# Patient Record
Sex: Female | Born: 1960 | Race: White | Hispanic: No | Marital: Single | Smoking: Never smoker
Health system: Southern US, Community
[De-identification: ages and names within clinical notes are randomized; demographics above are authoritative.]

## PROBLEM LIST (undated history)

## (undated) DIAGNOSIS — K219 Gastro-esophageal reflux disease without esophagitis: Secondary | ICD-10-CM

## (undated) DIAGNOSIS — E039 Hypothyroidism, unspecified: Secondary | ICD-10-CM

## (undated) DIAGNOSIS — M1990 Unspecified osteoarthritis, unspecified site: Secondary | ICD-10-CM

## (undated) HISTORY — PX: CHOLECYSTECTOMY: SHX55

---

## 1996-10-17 HISTORY — PX: TUBAL LIGATION: SHX77

## 2020-04-14 ENCOUNTER — Ambulatory Visit (INDEPENDENT_AMBULATORY_CARE_PROVIDER_SITE_OTHER): Payer: Self-pay

## 2020-04-14 ENCOUNTER — Ambulatory Visit: Payer: Self-pay | Admitting: Orthopaedic Surgery

## 2020-04-14 VITALS — Ht 60.0 in | Wt 192.0 lb

## 2020-04-14 DIAGNOSIS — M1611 Unilateral primary osteoarthritis, right hip: Secondary | ICD-10-CM

## 2020-04-14 NOTE — Progress Notes (Signed)
Office Visit Note   Patient: Carla Mckee           Date of Birth: 07-07-1961           MRN: [id number] Visit Date: 04/14/2020              Requested by: Cheral Bay, MD [address] [place],  [place] [address] PCP: Cheral Bay, MD   Assessment & Plan: Visit Diagnoses:  1. Primary osteoarthritis of right hip     Plan: I reviewed the hip x-rays today and she has severe degenerative joint disease with superior migration and subluxation of the femoral head and wear of the superior acetabulum.  Her leg is significantly shorter.  Given these findings I have recommended total hip replacement.  I do not feel that any medications or injections would provide her with any real relief.  Based on discussion risk benefits rehab recovery alternatives to surgery she has elected to proceed with the surgery in the near future.  We will be in touch with the patient soon.  Follow-Up Instructions: Return if symptoms worsen or fail to improve.   Orders:  Orders Placed This Encounter  Procedures  . XR HIP UNILAT W OR W/O PELVIS 2-3 VIEWS RIGHT   No orders of the defined types were placed in this encounter.     Procedures: No procedures performed   Clinical Data: No additional findings.   Subjective: Chief Complaint  Patient presents with  . Right Hip - Pain    Carla Mckee is a very pleasant 59 year old who comes in for chronic right hip pain for 4 years.  She has deep-seated right hip and groin pain that radiates into the thigh.  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  Denies any radicular symptoms.  She has taken over-the-counter medications without significant relief.   Review of Systems  Constitutional: Negative.   HENT: Negative.   Eyes: Negative.   Respiratory: Negative.   Cardiovascular: Negative.   Endocrine: Negative.   Musculoskeletal: Negative.   Neurological: Negative.   Hematological: Negative.   Psychiatric/Behavioral: Negative.    All other systems reviewed and are negative.    Objective: Vital Signs: Ht 5' (1.524 m)   Wt 192 lb (87.1 kg)   BMI 37.50 kg/m   Physical Exam Vitals and nursing note reviewed.  Constitutional:      Appearance: She is well-developed.  HENT:     Head: Normocephalic and atraumatic.  Pulmonary:     Effort: Pulmonary effort is normal.  Abdominal:     Palpations: Abdomen is soft.  Musculoskeletal:     Cervical back: Neck supple.  Skin:    General: Skin is warm.     Capillary Refill: Capillary refill takes less than 2 seconds.  Neurological:     Mental Status: She is alert and oriented to person, place, and time.  Psychiatric:        Behavior: Behavior normal.        Thought Content: Thought content normal.        Judgment: Judgment normal.     Ortho Exam Right hip shows severe pain with any attempted range of motion.  She has a severe antalgic gait.  She has to hop in order to walk. Specialty Comments:  No specialty comments available.  Imaging: XR HIP UNILAT W OR W/O PELVIS 2-3 VIEWS RIGHT  Result Date: 04/14/2020 Severe right hip DJD with superior migration of femoral head and wear of the  superior acetabulum.    PMFS History: There are no problems to display for this patient.  No past medical history on file.  No family history on file.   Social History   Occupational History  . Not on file  Tobacco Use  . Smoking status: Not on file  Substance and Sexual Activity  . Alcohol use: Not on file  . Drug use: Not on file  . Sexual activity: Not on file

## 2020-04-15 ENCOUNTER — Telehealth: Payer: Self-pay | Admitting: Orthopaedic Surgery

## 2020-04-15 NOTE — Telephone Encounter (Signed)
Patient called.   She was seen in our office yesterday by Dr.Xu but she states that it was in error. She said she was supposed to be seeing Dr.Blackman as that is who a family member of hers recommended.   She followed through with the appointment and was recommended for surgery. She wants to know what she needs to do to ensure the surgery is with Dr. Magnus Ivan.   Call back: [phone]

## 2020-04-15 NOTE — Telephone Encounter (Signed)
I called patient [phone] and left voice mail that she needs to return call to make OV appt. with Dr. Magnus Ivan.

## 2020-04-15 NOTE — Telephone Encounter (Signed)
Patient called back and I made appt to see Dr. Magnus Ivan next week.

## 2020-04-15 NOTE — Telephone Encounter (Signed)
Is there any way we can get her in next week for a quick appointment for a face-to-face discussion?  Which you be okay with that.

## 2020-04-22 ENCOUNTER — Other Ambulatory Visit: Payer: Self-pay

## 2020-04-22 ENCOUNTER — Ambulatory Visit (INDEPENDENT_AMBULATORY_CARE_PROVIDER_SITE_OTHER): Payer: Self-pay | Admitting: Orthopaedic Surgery

## 2020-04-22 DIAGNOSIS — M1612 Unilateral primary osteoarthritis, left hip: Secondary | ICD-10-CM

## 2020-04-22 DIAGNOSIS — M1611 Unilateral primary osteoarthritis, right hip: Secondary | ICD-10-CM

## 2020-04-22 NOTE — Progress Notes (Signed)
Office Visit Note   Patient: Carla Mckee           Date of Birth: [birth year]           MRN: [id number] Visit Date: 04/22/2020              Requested by: Cheral Bay, MD [address] [place],  [place] [address] PCP: Cheral Bay, MD   Assessment & Plan: Visit Diagnoses:  1. Primary osteoarthritis of right hip   2. Primary osteoarthritis of left hip     Plan: I agree that she does need a hip replacement on the right side and likely on the left side eventually.  Her right side is more symptomatic and much worse radiographically.  I had a long thorough discussion with her about hip replacement surgery.  We talked about the risk and benefits of surgery.  I showed her hip model and gave her handout about hip replacement surgery.  I described in detail her interoperative and postoperative course.  This is something she would like to have scheduled hopefully in the near future.  All questions and concerns were answered and addressed.  Follow-Up Instructions: Return for 2 weeks post-op.   Orders:  No orders of the defined types were placed in this encounter.  No orders of the defined types were placed in this encounter.     Procedures: No procedures performed   Clinical Data: No additional findings.   Subjective: Chief Complaint  Patient presents with  . Right Hip - Pain  The patient comes in today for me to talk with her about hip replacement surgery.  She actually has bilateral hip pain but her right hip is way more severe.  I actually replaced her brothers hip.  She specifically wanted to see me.  She did see my partner Dr. Roda Shutters last week in his clinic and he appropriately recommended hip replacement surgery.  She then called our office back and stated that since I had done her brother surgery, she originally wanted to be scheduled with me and would like me to consider hip replacement surgery on her.  She was very pleased with Dr. Roda Shutters, but since I have  relationship with her family and her brother is done well, she would like me to perform her surgery.  Her hip pain is daily bilaterally but the right is much worse than left.  It is severe.  It has detrimentally affected her mobility, her quality of life, and her activities day living.  She is work on weight loss and activity modification.  She tried anti-inflammatories.  She has had all forms of conservative treatment.  Her pain is now 10 out of 10.  She has failed conservative treatment for over a year.  She even uses a cane to ambulate.  Her mobility is significantly limited at this point.  HPI  Review of Systems She currently denies any headache, chest pain, shortness of breath, fever, chills, nausea, vomiting  Objective: Vital Signs: There were no vitals taken for this visit.  Physical Exam She is alert and orient x3 and in no acute distress Ortho ExamExamination of her right and left hip shows severe limitations in motion patient with stiffness and significant pain much worse on the right than the left.  She walks with a Trendelenburg gait favoring the right side.  She has significant truncal obesity but her abdomen is easily mobilized out of the way of the surgical field. Specialty Comments:  No  specialty comments available.  Imaging: No results found. X-rays of both hips are independently reviewed.  She has severe end-stage arthritis more so on the right than left but both him are very severe.  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  PMFS History: Patient Active Problem List   Diagnosis Date Noted  . Primary osteoarthritis of right hip 04/22/2020  . Primary osteoarthritis of left hip 04/22/2020   No past medical history on file.  No family history on file.   Social History   Occupational History  . Not on file  Tobacco Use  . Smoking  status: Not on file  Substance and Sexual Activity  . Alcohol use: Not on file  . Drug use: Not on file  . Sexual activity: Not on file

## 2020-04-29 ENCOUNTER — Telehealth: Payer: Self-pay | Admitting: Orthopaedic Surgery

## 2020-04-29 NOTE — Telephone Encounter (Signed)
[employer] forms received. Sent to [employer]. 

## 2020-05-01 ENCOUNTER — Other Ambulatory Visit: Payer: Self-pay

## 2020-05-06 ENCOUNTER — Other Ambulatory Visit: Payer: Self-pay | Admitting: Physician Assistant

## 2020-05-08 ENCOUNTER — Telehealth: Payer: Self-pay | Admitting: Orthopaedic Surgery

## 2020-05-08 NOTE — Telephone Encounter (Signed)
Pt would like to verify we received a fax containing her FMLA paperwork.

## 2020-05-11 NOTE — Telephone Encounter (Signed)
Forms have been received and sent to [employer]. Tried to call cell ph,mailbox is full.

## 2020-05-12 NOTE — Progress Notes (Signed)
[id number] Carla Mckee, [place] - [address] AT [facility] [address] [place] [address] Phone: [phone] Fax: [phone]      Your procedure is scheduled on Tuesday August 3  Report to [facility] Main Entrance "A" at 1000 A.M., and check in at the Admitting office.  Call this number if you have problems the morning of surgery:  [phone]  Call [phone] if you have any questions prior to your surgery date Monday-Friday 8am-4pm    Remember:  Do not eat after midnight the night before your surgery  You may drink clear liquids until 0900 am the morning of your surgery.   Clear liquids allowed are: Water, Non-Citrus Juices (without pulp), Carbonated Beverages, Clear Tea, Black Coffee Only, and Gatorade    Enhanced Recovery after Surgery for Orthopedics Enhanced Recovery after Surgery is a protocol used to improve the stress on your body and your recovery after surgery.  Patient Instructions  . The night before surgery:  o No food after midnight. ONLY clear liquids after midnight  .  Marland Kitchen The day of surgery (if you do NOT have diabetes):  o Drink ONE (1) Pre-Surgery Clear Ensure by __0900___ am the morning of surgery   o This drink was given to you during your hospital  pre-op appointment visit. o Nothing else to drink after completing the  Pre-Surgery Clear Ensure.          If you have questions, please contact your surgeon's office.   Take these medicines the morning of surgery with A SIP OF WATER  HYDROcodone-acetaminophen (NORCO)  If needed levothyroxine (SYNTHROID)  As of today, STOP taking any Aspirin (unless otherwise instructed by your surgeon) Aleve, Naproxen, Ibuprofen, Motrin, Advil, Goody's, BC's, all herbal medications, fish oil, and all vitamins.                      Do not wear jewelry, make up, or nail polish            Do not wear lotions, powders, perfumes, or deodorant.             Do not shave 48 hours prior to surgery.             Do not bring valuables to the hospital.            [facility] is not responsible for any belongings or valuables.  Do NOT Smoke (Tobacco/Vaping) or drink Alcohol 24 hours prior to your procedure If you use a CPAP at night, you may bring all equipment for your overnight stay.   Contacts, glasses, dentures or bridgework may not be worn into surgery.      For patients admitted to the hospital, discharge time will be determined by your treatment team.   Patients discharged the day of surgery will not be allowed to drive home, and someone needs to stay with them for 24 hours.    Special instructions:   [facility]- Preparing For Surgery  Before surgery, you can play an important role. Because skin is not sterile, your skin needs to be as free of germs as possible. You can reduce the number of germs on your skin by washing with CHG (chlorahexidine gluconate) Soap before surgery.  CHG is an antiseptic cleaner which kills germs and bonds with the skin to continue killing germs even after washing.    Oral Hygiene is also important to reduce your  risk of infection.  Remember - BRUSH YOUR TEETH THE MORNING OF SURGERY WITH YOUR REGULAR TOOTHPASTE  Please do not use if you have an allergy to CHG or antibacterial soaps. If your skin becomes reddened/irritated stop using the CHG.  Do not shave (including legs and underarms) for at least 48 hours prior to first CHG shower. It is OK to shave your face.  Please follow these instructions carefully.   1. Shower the NIGHT BEFORE SURGERY and the MORNING OF SURGERY with CHG Soap.   2. If you chose to wash your hair, wash your hair first as usual with your normal shampoo.  3. After you shampoo, rinse your hair and body thoroughly to remove the shampoo.  4. Use CHG as you would any other liquid soap. You can apply CHG directly to the skin and wash gently with a scrungie or a clean washcloth.   5. Apply  the CHG Soap to your body ONLY FROM THE NECK DOWN.  Do not use on open wounds or open sores. Avoid contact with your eyes, ears, mouth and genitals (private parts). Wash Face and genitals (private parts)  with your normal soap.   6. Wash thoroughly, paying special attention to the area where your surgery will be performed.  7. Thoroughly rinse your body with warm water from the neck down.  8. DO NOT shower/wash with your normal soap after using and rinsing off the CHG Soap.  9. Pat yourself dry with a CLEAN TOWEL.  10. Wear CLEAN PAJAMAS to bed the night before surgery  11. Place CLEAN SHEETS on your bed the night of your first shower and DO NOT SLEEP WITH PETS.   Day of Surgery: Wear Clean/Comfortable clothing the morning of surgery Do not apply any deodorants/lotions.   Remember to brush your teeth WITH YOUR REGULAR TOOTHPASTE.   Please read over the following fact sheets that you were given.

## 2020-05-13 ENCOUNTER — Encounter (HOSPITAL_COMMUNITY)
Admission: RE | Admit: 2020-05-13 | Discharge: 2020-05-13 | Disposition: A | Discharge status: Home / Self Care | Payer: Self-pay | Source: Ambulatory Visit | Attending: Orthopaedic Surgery | Admitting: Orthopaedic Surgery

## 2020-05-13 ENCOUNTER — Other Ambulatory Visit: Payer: Self-pay

## 2020-05-13 ENCOUNTER — Telehealth: Payer: Self-pay | Admitting: Orthopaedic Surgery

## 2020-05-13 ENCOUNTER — Encounter (HOSPITAL_COMMUNITY): Payer: Self-pay

## 2020-05-13 DIAGNOSIS — Z01812 Encounter for preprocedural laboratory examination: Secondary | ICD-10-CM | POA: Insufficient documentation

## 2020-05-13 HISTORY — DX: Gastro-esophageal reflux disease without esophagitis: K21.9

## 2020-05-13 HISTORY — DX: Hypothyroidism, unspecified: E03.9

## 2020-05-13 LAB — CBC
HCT: 40.3 % (ref 36.0–46.0)
Hemoglobin: 12.7 g/dL (ref 12.0–15.0)
MCH: 30.5 pg (ref 26.0–34.0)
MCHC: 31.5 g/dL (ref 30.0–36.0)
MCV: 96.9 fL (ref 80.0–100.0)
Platelets: 457 10*3/uL — ABNORMAL HIGH (ref 150–400)
RBC: 4.16 MIL/uL (ref 3.87–5.11)
RDW: 13.5 % (ref 11.5–15.5)
WBC: 5.5 10*3/uL (ref 4.0–10.5)
nRBC: 0 % (ref 0.0–0.2)

## 2020-05-13 LAB — TYPE AND SCREEN
ABO/RH(D): A POS
Antibody Screen: NEGATIVE

## 2020-05-13 LAB — SURGICAL PCR SCREEN
MRSA, PCR: NEGATIVE
Staphylococcus aureus: NEGATIVE

## 2020-05-13 NOTE — Progress Notes (Signed)
PCP - Derek Jack  Chest x-ray - n/a EKG - n/a  ERAS Protcol - yes, Ensure ordered & given   COVID TEST- Friday 05-15-20   Anesthesia review: n/a  Patient denies shortness of breath, fever, cough and chest pain at PAT appointment   All instructions explained to the patient, with a verbal understanding of the material. Patient agrees to go over the instructions while at home for a better understanding. Patient also instructed to self quarantine after being tested for COVID-19. The opportunity to ask questions was provided.

## 2020-05-13 NOTE — Telephone Encounter (Signed)
Tried calling pt to let her know she has to bring exact cash for her [employer] payment; all three numbers went to voice mail and was full so I couldn't leave a message.

## 2020-05-15 ENCOUNTER — Other Ambulatory Visit (HOSPITAL_COMMUNITY): Payer: Self-pay

## 2020-05-15 ENCOUNTER — Telehealth: Payer: Self-pay

## 2020-05-15 NOTE — Telephone Encounter (Signed)
Surgery postponed to 09/07 to allow time for FMLA paperwork to be done.  Can you Rx something for pain?  She uses [facility].

## 2020-05-16 ENCOUNTER — Other Ambulatory Visit: Payer: Self-pay | Admitting: Orthopaedic Surgery

## 2020-05-16 MED ORDER — TRAMADOL HCL 50 MG PO TABS: 100.0000 mg | ORAL_TABLET | Freq: Four times a day (QID) | ORAL | 0 refills | Status: DC | PRN

## 2020-05-19 ENCOUNTER — Ambulatory Visit (HOSPITAL_COMMUNITY): Admission: RE | Admit: 2020-05-19 | Payer: Self-pay | Source: Home / Self Care | Admitting: Orthopaedic Surgery

## 2020-05-19 ENCOUNTER — Encounter (HOSPITAL_COMMUNITY): Admission: RE | Payer: Self-pay | Source: Home / Self Care

## 2020-05-19 SURGERY — ARTHROPLASTY, HIP, TOTAL, ANTERIOR APPROACH
Anesthesia: Spinal | Site: Hip | Laterality: Right

## 2020-05-27 ENCOUNTER — Telehealth: Payer: Self-pay | Admitting: Orthopaedic Surgery

## 2020-05-27 ENCOUNTER — Telehealth: Payer: Self-pay

## 2020-05-27 NOTE — Telephone Encounter (Signed)
Disability form from [employer] received and sent to [employer]

## 2020-05-27 NOTE — Telephone Encounter (Signed)
Pt called stating a intermediate form for work will be faxed and she would like for Dr.Blackman to keep an eye out for it.  [phone]

## 2020-05-29 ENCOUNTER — Encounter: Payer: Self-pay | Admitting: Orthopaedic Surgery

## 2020-05-29 ENCOUNTER — Telehealth: Payer: Self-pay | Admitting: Orthopaedic Surgery

## 2020-05-29 NOTE — Telephone Encounter (Signed)
Patient called.   Letting us know that [employer] sent over more paperwork on her behalf today.   Call back: [phone]

## 2020-06-01 NOTE — Telephone Encounter (Signed)
She has called several times, but most likely these are going through [employer] right?

## 2020-06-01 NOTE — Telephone Encounter (Signed)
Thank you :)

## 2020-06-01 NOTE — Telephone Encounter (Signed)
Received today, will send to [employer] with copy of her my chart msg.

## 2020-06-03 ENCOUNTER — Other Ambulatory Visit: Payer: Self-pay | Admitting: Orthopaedic Surgery

## 2020-06-03 ENCOUNTER — Telehealth: Payer: Self-pay | Admitting: Orthopaedic Surgery

## 2020-06-03 MED ORDER — HYDROCODONE-ACETAMINOPHEN 10-325 MG PO TABS: 1.0000 | ORAL_TABLET | Freq: Two times a day (BID) | ORAL | 0 refills | Status: DC | PRN

## 2020-06-03 NOTE — Telephone Encounter (Signed)
Patient called.   Requesting a refill on her pain medications be sent in   Call back: [phone]

## 2020-06-03 NOTE — Telephone Encounter (Signed)
Please advise 

## 2020-06-05 ENCOUNTER — Telehealth: Payer: Self-pay | Admitting: Orthopaedic Surgery

## 2020-06-05 NOTE — Telephone Encounter (Signed)
Faxed updated forms to [employer] [phone]

## 2020-06-11 ENCOUNTER — Telehealth: Payer: Self-pay | Admitting: Orthopaedic Surgery

## 2020-06-11 NOTE — Telephone Encounter (Signed)
[employer] forms received via e-mail from patient. Completed and faxed to [facility]

## 2020-06-12 ENCOUNTER — Other Ambulatory Visit: Payer: Self-pay | Admitting: Orthopaedic Surgery

## 2020-06-15 ENCOUNTER — Other Ambulatory Visit: Payer: Self-pay | Admitting: Orthopaedic Surgery

## 2020-06-15 MED ORDER — HYDROCODONE-ACETAMINOPHEN 10-325 MG PO TABS: 1.0000 | ORAL_TABLET | Freq: Two times a day (BID) | ORAL | 0 refills | Status: DC | PRN

## 2020-06-15 NOTE — Telephone Encounter (Signed)
Please advise 

## 2020-06-16 ENCOUNTER — Other Ambulatory Visit: Payer: Self-pay | Admitting: Physician Assistant

## 2020-06-16 NOTE — Patient Instructions (Signed)
DUE TO COVID-19 ONLY ONE VISITOR IS ALLOWED TO COME WITH YOU AND STAY IN THE WAITING ROOM ONLY DURING  PRE OP AND PROCEDURE.   IF YOU WILL BE ADMITTED INTO THE HOSPITAL YOU ARE ALLOWED ONE SUPPORT PERSON DURING VISITATION HOURS  ONLY (10AM -8PM)   . The support person may change daily. . The support person must pass our screening, gel in and out, and wear a mask at all times, including in the patient's room. . Patients must also wear a mask when staff or their support person are in the room.   COVID SWAB TESTING MUST BE COMPLETED ON:  Friday, 06-19-20 @ 10:55     [address] [place], [place] [address]  (Must self quarantine after testing. Follow instructions on handout.)        Your procedure is scheduled on:  Tuesday, 06-23-20   Report to [facility] Main  Entrance    Report to admitting at 9:30 AM   Call this number if you have problems the morning of surgery [phone]   Do not eat food :After Midnight.   May have liquids until 9:00 AM  day of surgery   CLEAR LIQUID DIET  Foods Allowed                                                                     Foods Excluded  Water, Black Coffee and tea, regular and decaf          liquids that you cannot  Plain Jell-O in any flavor  (No red)                                    see through such as: Fruit ices (not with fruit pulp)                                      milk, soups, orange juice              Iced Popsicles (No red)                                      All solid food                                   Apple juices Sports drinks like Gatorade (No red) Lightly seasoned clear broth or consume(fat free) Sugar, honey syrup     Complete one Ensure drink the morning of surgery at 9:00 AM  the day of surgery.      Oral Hygiene is also important to reduce your risk of infection.                                    Remember - BRUSH YOUR TEETH THE MORNING OF SURGERY WITH YOUR REGULAR TOOTHPASTE   Do NOT smoke after  Midnight   Take  these medicines the morning of surgery with A SIP OF WATER:  Hydrocodone, Levothyroxine                                You may not have any metal on your body including hair pins, jewelry, and body piercings             Do not wear make-up, lotions, powders, perfumes/cologne, or deodorant             Do not wear nail polish.  Do not shave  48 hours prior to surgery.            Do not bring valuables to the hospital. [facility] IS NOT RESPONSIBLE   FOR VALUABLES.   Contacts, dentures or bridgework may not be worn into surgery.      Patients discharged the day of surgery will not be allowed to drive home.                Please read over the following fact sheets you were given: IF YOU HAVE QUESTIONS ABOUT YOUR PRE OP INSTRUCTIONS  PLEASE CALL [phone]   [facility] - Preparing for Surgery Before surgery, you can play an important role.  Because skin is not sterile, your skin needs to be as free of germs as possible.  You can reduce the number of germs on your skin by washing with CHG (chlorahexidine gluconate) soap before surgery.  CHG is an antiseptic cleaner which kills germs and bonds with the skin to continue killing germs even after washing. Please DO NOT use if you have an allergy to CHG or antibacterial soaps.  If your skin becomes reddened/irritated stop using the CHG and inform your nurse when you arrive at Short Stay. Do not shave (including legs and underarms) for at least 48 hours prior to the first CHG shower.  You may shave your face/neck.  Please follow these instructions carefully:  1.  Shower with CHG Soap the night before surgery and the  morning of surgery.  2.  If you choose to wash your hair, wash your hair first as usual with your normal  shampoo.  3.  After you shampoo, rinse your hair and body thoroughly to remove the shampoo.                             4.  Use CHG as you would any other liquid soap.  You can apply chg directly to the skin and  wash.  Gently with a scrungie or clean washcloth.  5.  Apply the CHG Soap to your body ONLY FROM THE NECK DOWN.   Do   not use on face/ open                           Wound or open sores. Avoid contact with eyes, ears mouth and   genitals (private parts).                       Wash face,  Genitals (private parts) with your normal soap.             6.  Wash thoroughly, paying special attention to the area where your    surgery  will be performed.  7.  Thoroughly rinse your body with warm water from the neck down.  8.  DO NOT shower/wash with your normal soap after using and rinsing off the CHG Soap.                9.  Pat yourself dry with a clean towel.            10.  Wear clean pajamas.            11.  Place clean sheets on your bed the night of your first shower and do not  sleep with pets. Day of Surgery : Do not apply any lotions/deodorants the morning of surgery.  Please wear clean clothes to the hospital/surgery center.  FAILURE TO FOLLOW THESE INSTRUCTIONS MAY RESULT IN THE CANCELLATION OF YOUR SURGERY  PATIENT SIGNATURE_________________________________  NURSE SIGNATURE__________________________________  ________________________________________________________________________   Rogelia Mire  An incentive spirometer is a tool that can help keep your lungs clear and active. This tool measures how well you are filling your lungs with each breath. Taking long deep breaths may help reverse or decrease the chance of developing breathing (pulmonary) problems (especially infection) following:  A long period of time when you are unable to move or be active. BEFORE THE PROCEDURE   If the spirometer includes an indicator to show your best effort, your nurse or respiratory therapist will set it to a desired goal.  If possible, sit up straight or lean slightly forward. Try not to slouch.  Hold the incentive spirometer in an upright position. INSTRUCTIONS FOR USE  1. Sit on the  edge of your bed if possible, or sit up as far as you can in bed or on a chair. 2. Hold the incentive spirometer in an upright position. 3. Breathe out normally. 4. Place the mouthpiece in your mouth and seal your lips tightly around it. 5. Breathe in slowly and as deeply as possible, raising the piston or the ball toward the top of the column. 6. Hold your breath for 3-5 seconds or for as long as possible. Allow the piston or ball to fall to the bottom of the column. 7. Remove the mouthpiece from your mouth and breathe out normally. 8. Rest for a few seconds and repeat Steps 1 through 7 at least 10 times every 1-2 hours when you are awake. Take your time and take a few normal breaths between deep breaths. 9. The spirometer may include an indicator to show your best effort. Use the indicator as a goal to work toward during each repetition. 10. After each set of 10 deep breaths, practice coughing to be sure your lungs are clear. If you have an incision (the cut made at the time of surgery), support your incision when coughing by placing a pillow or rolled up towels firmly against it. Once you are able to get out of bed, walk around indoors and cough well. You may stop using the incentive spirometer when instructed by your caregiver.  RISKS AND COMPLICATIONS  Take your time so you do not get dizzy or light-headed.  If you are in pain, you may need to take or ask for pain medication before doing incentive spirometry. It is harder to take a deep breath if you are having pain. AFTER USE  Rest and breathe slowly and easily.  It can be helpful to keep track of a log of your progress. Your caregiver can provide you with a simple table to help with this. If you are using the spirometer at home, follow these instructions: SEEK MEDICAL CARE IF:   You  are having difficultly using the spirometer.  You have trouble using the spirometer as often as instructed.  Your pain medication is not giving enough  relief while using the spirometer.  You develop fever of 100.5 F (38.1 C) or higher. SEEK IMMEDIATE MEDICAL CARE IF:   You cough up bloody sputum that had not been present before.  You develop fever of 102 F (38.9 C) or greater.  You develop worsening pain at or near the incision site. MAKE SURE YOU:   Understand these instructions.  Will watch your condition.  Will get help right away if you are not doing well or get worse. Document Released: 02/13/2007 Document Revised: 12/26/2011 Document Reviewed: 04/16/2007 [employer] Patient Information 2014 [employer], [place].   ________________________________________________________________________  WHAT IS A BLOOD TRANSFUSION? Blood Transfusion Information  A transfusion is the replacement of blood or some of its parts. Blood is made up of multiple cells which provide different functions.  Red blood cells carry oxygen and are used for blood loss replacement.  White blood cells fight against infection.  Platelets control bleeding.  Plasma helps clot blood.  Other blood products are available for specialized needs, such as hemophilia or other clotting disorders. BEFORE THE TRANSFUSION  Who gives blood for transfusions?   Healthy volunteers who are fully evaluated to make sure their blood is safe. This is blood bank blood. Transfusion therapy is the safest it has ever been in the practice of medicine. Before blood is taken from a donor, a complete history is taken to make sure that person has no history of diseases nor engages in risky social behavior (examples are intravenous drug use or sexual activity with multiple partners). The donor's travel history is screened to minimize risk of transmitting infections, such as malaria. The donated blood is tested for signs of infectious diseases, such as HIV and hepatitis. The blood is then tested to be sure it is compatible with you in order to minimize the chance of a transfusion reaction. If  you or a relative donates blood, this is often done in anticipation of surgery and is not appropriate for emergency situations. It takes many days to process the donated blood. RISKS AND COMPLICATIONS Although transfusion therapy is very safe and saves many lives, the main dangers of transfusion include:   Getting an infectious disease.  Developing a transfusion reaction. This is an allergic reaction to something in the blood you were given. Every precaution is taken to prevent this. The decision to have a blood transfusion has been considered carefully by your caregiver before blood is given. Blood is not given unless the benefits outweigh the risks. AFTER THE TRANSFUSION  Right after receiving a blood transfusion, you will usually feel much better and more energetic. This is especially true if your red blood cells have gotten low (anemic). The transfusion raises the level of the red blood cells which carry oxygen, and this usually causes an energy increase.  The nurse administering the transfusion will monitor you carefully for complications. HOME CARE INSTRUCTIONS  No special instructions are needed after a transfusion. You may find your energy is better. Speak with your caregiver about any limitations on activity for underlying diseases you may have. SEEK MEDICAL CARE IF:   Your condition is not improving after your transfusion.  You develop redness or irritation at the intravenous (IV) site. SEEK IMMEDIATE MEDICAL CARE IF:  Any of the following symptoms occur over the next 12 hours:  Shaking chills.  You have a temperature by  mouth above 102 F (38.9 C), not controlled by medicine.  Chest, back, or muscle pain.  People around you feel you are not acting correctly or are confused.  Shortness of breath or difficulty breathing.  Dizziness and fainting.  You get a rash or develop hives.  You have a decrease in urine output.  Your urine turns a dark color or changes to pink,  red, or brown. Any of the following symptoms occur over the next 10 days:  You have a temperature by mouth above 102 F (38.9 C), not controlled by medicine.  Shortness of breath.  Weakness after normal activity.  The white part of the eye turns yellow (jaundice).  You have a decrease in the amount of urine or are urinating less often.  Your urine turns a dark color or changes to pink, red, or brown. Document Released: 09/30/2000 Document Revised: 12/26/2011 Document Reviewed: 05/19/2008 [facility] Patient Information 2014 [place], [place].  _______________________________________________________________________

## 2020-06-16 NOTE — Progress Notes (Addendum)
COVID Vaccine Completed:  No Date COVID Vaccine completed: COVID vaccine manufacturer: Pfizer    Moderna   [employer]   PCP - Derek Jack, MD Cardiologist -   Chest x-ray -  EKG -  Stress Test -  ECHO -  Cardiac Cath -   Sleep Study -  CPAP -   Fasting Blood Sugar -  Checks Blood Sugar _____ times a day  Blood Thinner Instructions: Aspirin Instructions: Last Dose:  Anesthesia review:   Patient denies shortness of breath, fever, cough and chest pain at PAT appointment   Patient verbalized understanding of instructions that were given to them at the PAT appointment. Patient was also instructed that they will need to review over the PAT instructions again at home before surgery.

## 2020-06-19 ENCOUNTER — Encounter (HOSPITAL_COMMUNITY)
Admission: RE | Admit: 2020-06-19 | Discharge: 2020-06-19 | Disposition: A | Discharge status: Home / Self Care | Payer: Self-pay | Source: Ambulatory Visit | Attending: Orthopaedic Surgery | Admitting: Orthopaedic Surgery

## 2020-06-19 ENCOUNTER — Other Ambulatory Visit (HOSPITAL_COMMUNITY): Payer: Self-pay

## 2020-06-19 ENCOUNTER — Other Ambulatory Visit (HOSPITAL_COMMUNITY)
Admission: RE | Admit: 2020-06-19 | Discharge: 2020-06-19 | Disposition: A | Discharge status: Home / Self Care | Payer: Self-pay | Source: Ambulatory Visit | Attending: Orthopaedic Surgery | Admitting: Orthopaedic Surgery

## 2020-06-19 ENCOUNTER — Other Ambulatory Visit: Payer: Self-pay

## 2020-06-19 ENCOUNTER — Encounter (HOSPITAL_COMMUNITY): Payer: Self-pay

## 2020-06-19 ENCOUNTER — Inpatient Hospital Stay (HOSPITAL_COMMUNITY): Admission: RE | Admit: 2020-06-19 | Payer: Self-pay | Source: Ambulatory Visit

## 2020-06-19 DIAGNOSIS — Z01812 Encounter for preprocedural laboratory examination: Secondary | ICD-10-CM | POA: Insufficient documentation

## 2020-06-19 DIAGNOSIS — Z20822 Contact with and (suspected) exposure to covid-19: Secondary | ICD-10-CM | POA: Insufficient documentation

## 2020-06-19 HISTORY — DX: Unspecified osteoarthritis, unspecified site: M19.90

## 2020-06-19 LAB — CBC
HCT: 42.8 % (ref 36.0–46.0)
Hemoglobin: 13.2 g/dL (ref 12.0–15.0)
MCH: 30.7 pg (ref 26.0–34.0)
MCHC: 30.8 g/dL (ref 30.0–36.0)
MCV: 99.5 fL (ref 80.0–100.0)
Platelets: 426 10*3/uL — ABNORMAL HIGH (ref 150–400)
RBC: 4.3 MIL/uL (ref 3.87–5.11)
RDW: 13.5 % (ref 11.5–15.5)
WBC: 6.4 10*3/uL (ref 4.0–10.5)
nRBC: 0 % (ref 0.0–0.2)

## 2020-06-19 LAB — SURGICAL PCR SCREEN
MRSA, PCR: NEGATIVE
Staphylococcus aureus: NEGATIVE

## 2020-06-19 LAB — SARS CORONAVIRUS 2 (TAT 6-24 HRS): SARS Coronavirus 2: NEGATIVE

## 2020-06-22 NOTE — Anesthesia Preprocedure Evaluation (Addendum)
Anesthesia Evaluation  Patient identified by MRN, date of birth, ID band Patient awake    Reviewed: Allergy & Precautions, NPO status , Patient's Chart, lab work & pertinent test results  Airway Mallampati: II  TM Distance: >3 FB Neck ROM: Full    Dental no notable dental hx. (+) Teeth Intact, Dental Advisory Given   Pulmonary neg pulmonary ROS,    Pulmonary exam normal breath sounds clear to auscultation       Cardiovascular Exercise Tolerance: Good negative cardio ROS Normal cardiovascular exam Rhythm:Regular Rate:Normal     Neuro/Psych negative neurological ROS  negative psych ROS   GI/Hepatic Neg liver ROS, GERD  ,  Endo/Other  Hypothyroidism   Renal/GU negative Renal ROS     Musculoskeletal  (+) Arthritis ,   Abdominal   Peds  Hematology negative hematology ROS (+) Hgb 13.2 Plt 426   Anesthesia Other Findings   Reproductive/Obstetrics                            Anesthesia Physical Anesthesia Plan  ASA: II  Anesthesia Plan: Spinal   Post-op Pain Management:    Induction:   PONV Risk Score and Plan: 3 and Treatment may vary due to age or medical condition, Midazolam, Ondansetron and Propofol infusion  Airway Management Planned: Natural Airway  Additional Equipment: None  Intra-op Plan:   Post-operative Plan:   Informed Consent: I have reviewed the patients History and Physical, chart, labs and discussed the procedure including the risks, benefits and alternatives for the proposed anesthesia with the patient or authorized representative who has indicated his/her understanding and acceptance.     Dental advisory given  Plan Discussed with: CRNA and Anesthesiologist  Anesthesia Plan Comments: (R THR under spinal)       Anesthesia Quick Evaluation

## 2020-06-23 ENCOUNTER — Ambulatory Visit (HOSPITAL_COMMUNITY): Payer: Self-pay | Admitting: Anesthesiology

## 2020-06-23 ENCOUNTER — Encounter (HOSPITAL_COMMUNITY): Payer: Self-pay | Admitting: Orthopaedic Surgery

## 2020-06-23 ENCOUNTER — Ambulatory Visit (HOSPITAL_COMMUNITY): Payer: Self-pay

## 2020-06-23 ENCOUNTER — Other Ambulatory Visit: Payer: Self-pay

## 2020-06-23 ENCOUNTER — Other Ambulatory Visit: Payer: Self-pay | Admitting: Orthopaedic Surgery

## 2020-06-23 ENCOUNTER — Encounter (HOSPITAL_COMMUNITY)
Admission: RE | Disposition: A | Discharge status: Home / Self Care | Payer: Self-pay | Source: Home / Self Care | Attending: Orthopaedic Surgery

## 2020-06-23 ENCOUNTER — Observation Stay (HOSPITAL_COMMUNITY)
Admission: RE | Admit: 2020-06-23 | Discharge: 2020-06-24 | Disposition: A | Discharge status: Home / Self Care | Payer: Self-pay | Attending: Orthopaedic Surgery | Admitting: Orthopaedic Surgery

## 2020-06-23 DIAGNOSIS — E039 Hypothyroidism, unspecified: Secondary | ICD-10-CM | POA: Insufficient documentation

## 2020-06-23 DIAGNOSIS — Z96641 Presence of right artificial hip joint: Secondary | ICD-10-CM

## 2020-06-23 DIAGNOSIS — M1990 Unspecified osteoarthritis, unspecified site: Secondary | ICD-10-CM | POA: Diagnosis present

## 2020-06-23 DIAGNOSIS — M1611 Unilateral primary osteoarthritis, right hip: Principal | ICD-10-CM

## 2020-06-23 DIAGNOSIS — Z419 Encounter for procedure for purposes other than remedying health state, unspecified: Secondary | ICD-10-CM

## 2020-06-23 DIAGNOSIS — Z79899 Other long term (current) drug therapy: Secondary | ICD-10-CM | POA: Insufficient documentation

## 2020-06-23 HISTORY — PX: TOTAL HIP ARTHROPLASTY: SHX124

## 2020-06-23 LAB — TYPE AND SCREEN
ABO/RH(D): A POS
Antibody Screen: NEGATIVE

## 2020-06-23 SURGERY — ARTHROPLASTY, HIP, TOTAL, ANTERIOR APPROACH
Anesthesia: Spinal | Site: Hip | Laterality: Right

## 2020-06-23 MED ORDER — ONDANSETRON HCL 4 MG/2ML IJ SOLN: 4.0000 mg | Freq: Four times a day (QID) | INTRAMUSCULAR | Status: DC | PRN

## 2020-06-23 MED ORDER — POVIDONE-IODINE 10 % EX SWAB
2.0000 "application " | Freq: Once | CUTANEOUS | Status: AC
  Administered 2020-06-23: 2 via TOPICAL

## 2020-06-23 MED ORDER — PHENOL 1.4 % MT LIQD: 1.0000 | OROMUCOSAL | Status: DC | PRN

## 2020-06-23 MED ORDER — ONDANSETRON 4 MG PO TBDP: 4.0000 mg | ORAL_TABLET | Freq: Three times a day (TID) | ORAL | 0 refills | Status: DC | PRN

## 2020-06-23 MED ORDER — ONDANSETRON HCL 4 MG/2ML IJ SOLN: 4.0000 mg | Freq: Once | INTRAMUSCULAR | Status: DC | PRN

## 2020-06-23 MED ORDER — BUPIVACAINE-EPINEPHRINE (PF) 0.25% -1:200000 IJ SOLN
INTRAMUSCULAR | Status: AC
  Filled 2020-06-23: qty 30

## 2020-06-23 MED ORDER — PROPOFOL 1000 MG/100ML IV EMUL
INTRAVENOUS | Status: AC
  Filled 2020-06-23: qty 100

## 2020-06-23 MED ORDER — DEXAMETHASONE SODIUM PHOSPHATE 10 MG/ML IJ SOLN
INTRAMUSCULAR | Status: AC
  Filled 2020-06-23: qty 1

## 2020-06-23 MED ORDER — TRANEXAMIC ACID-NACL 1000-0.7 MG/100ML-% IV SOLN
1000.0000 mg | INTRAVENOUS | Status: AC
  Administered 2020-06-23: 1000 mg via INTRAVENOUS
  Filled 2020-06-23: qty 100

## 2020-06-23 MED ORDER — ASPIRIN 81 MG PO CHEW
81.0000 mg | CHEWABLE_TABLET | Freq: Two times a day (BID) | ORAL | Status: DC
  Administered 2020-06-24: 81 mg via ORAL
  Filled 2020-06-23: qty 1

## 2020-06-23 MED ORDER — ACETAMINOPHEN 325 MG PO TABS: 325.0000 mg | ORAL_TABLET | Freq: Four times a day (QID) | ORAL | Status: DC | PRN

## 2020-06-23 MED ORDER — HYDROMORPHONE HCL 1 MG/ML IJ SOLN
0.5000 mg | INTRAMUSCULAR | Status: DC | PRN
  Administered 2020-06-24: 1 mg via INTRAVENOUS
  Filled 2020-06-23: qty 1

## 2020-06-23 MED ORDER — SODIUM CHLORIDE 0.9 % IR SOLN
Status: DC | PRN
  Administered 2020-06-23: 1000 mL

## 2020-06-23 MED ORDER — METOCLOPRAMIDE HCL 5 MG/ML IJ SOLN: 5.0000 mg | Freq: Three times a day (TID) | INTRAMUSCULAR | Status: DC | PRN

## 2020-06-23 MED ORDER — CEFAZOLIN SODIUM-DEXTROSE 1-4 GM/50ML-% IV SOLN
1.0000 g | Freq: Four times a day (QID) | INTRAVENOUS | Status: AC
  Administered 2020-06-23 – 2020-06-24 (×2): 1 g via INTRAVENOUS
  Filled 2020-06-23 (×2): qty 50

## 2020-06-23 MED ORDER — PROPOFOL 500 MG/50ML IV EMUL
INTRAVENOUS | Status: DC | PRN
  Administered 2020-06-23: 125 ug/kg/min via INTRAVENOUS

## 2020-06-23 MED ORDER — LACTATED RINGERS IV SOLN: INTRAVENOUS | Status: DC

## 2020-06-23 MED ORDER — SODIUM CHLORIDE 0.9 % IV SOLN: INTRAVENOUS | Status: DC

## 2020-06-23 MED ORDER — METHOCARBAMOL 500 MG PO TABS: 500.0000 mg | ORAL_TABLET | Freq: Four times a day (QID) | ORAL | 1 refills | Status: DC | PRN

## 2020-06-23 MED ORDER — 0.9 % SODIUM CHLORIDE (POUR BTL) OPTIME
TOPICAL | Status: DC | PRN
  Administered 2020-06-23: 1000 mL

## 2020-06-23 MED ORDER — MEPERIDINE HCL 50 MG/ML IJ SOLN: 6.2500 mg | INTRAMUSCULAR | Status: DC | PRN

## 2020-06-23 MED ORDER — ONDANSETRON 4 MG PO TBDP: 4.0000 mg | ORAL_TABLET | Freq: Three times a day (TID) | ORAL | Status: DC | PRN

## 2020-06-23 MED ORDER — ONDANSETRON HCL 4 MG PO TABS: 4.0000 mg | ORAL_TABLET | Freq: Four times a day (QID) | ORAL | Status: DC | PRN

## 2020-06-23 MED ORDER — ASPIRIN 81 MG PO CHEW: 81.0000 mg | CHEWABLE_TABLET | Freq: Two times a day (BID) | ORAL | 0 refills | Status: DC

## 2020-06-23 MED ORDER — PHENYLEPHRINE 40 MCG/ML (10ML) SYRINGE FOR IV PUSH (FOR BLOOD PRESSURE SUPPORT)
PREFILLED_SYRINGE | INTRAVENOUS | Status: DC | PRN
  Administered 2020-06-23: 80 ug via INTRAVENOUS
  Administered 2020-06-23: 40 ug via INTRAVENOUS
  Administered 2020-06-23 (×2): 80 ug via INTRAVENOUS
  Administered 2020-06-23: 120 ug via INTRAVENOUS

## 2020-06-23 MED ORDER — ONDANSETRON HCL 4 MG/2ML IJ SOLN
INTRAMUSCULAR | Status: AC
  Filled 2020-06-23: qty 2

## 2020-06-23 MED ORDER — METOCLOPRAMIDE HCL 5 MG PO TABS: 5.0000 mg | ORAL_TABLET | Freq: Three times a day (TID) | ORAL | Status: DC | PRN

## 2020-06-23 MED ORDER — OXYCODONE HCL 5 MG/5ML PO SOLN: 5.0000 mg | Freq: Once | ORAL | Status: AC | PRN

## 2020-06-23 MED ORDER — ASPIRIN 81 MG PO CHEW: 81.0000 mg | CHEWABLE_TABLET | Freq: Two times a day (BID) | ORAL | Status: DC

## 2020-06-23 MED ORDER — PHENYLEPHRINE 40 MCG/ML (10ML) SYRINGE FOR IV PUSH (FOR BLOOD PRESSURE SUPPORT)
PREFILLED_SYRINGE | INTRAVENOUS | Status: AC
  Filled 2020-06-23: qty 10

## 2020-06-23 MED ORDER — OXYCODONE HCL 5 MG PO TABS: 5.0000 mg | ORAL_TABLET | ORAL | Status: DC | PRN

## 2020-06-23 MED ORDER — STERILE WATER FOR IRRIGATION IR SOLN
Status: DC | PRN
  Administered 2020-06-23: 2000 mL

## 2020-06-23 MED ORDER — MENTHOL 3 MG MT LOZG: 1.0000 | LOZENGE | OROMUCOSAL | Status: DC | PRN

## 2020-06-23 MED ORDER — FUROSEMIDE 40 MG PO TABS
40.0000 mg | ORAL_TABLET | Freq: Every day | ORAL | Status: DC
  Administered 2020-06-23: 40 mg via ORAL
  Filled 2020-06-23 (×2): qty 1

## 2020-06-23 MED ORDER — CEFAZOLIN SODIUM-DEXTROSE 2-4 GM/100ML-% IV SOLN
2.0000 g | INTRAVENOUS | Status: AC
  Administered 2020-06-23: 2 g via INTRAVENOUS
  Filled 2020-06-23: qty 100

## 2020-06-23 MED ORDER — ALUM & MAG HYDROXIDE-SIMETH 200-200-20 MG/5ML PO SUSP: 30.0000 mL | ORAL | Status: DC | PRN

## 2020-06-23 MED ORDER — BUPIVACAINE IN DEXTROSE 0.75-8.25 % IT SOLN
INTRATHECAL | Status: DC | PRN
  Administered 2020-06-23: 1.6 mL via INTRATHECAL

## 2020-06-23 MED ORDER — OXYCODONE HCL 5 MG PO TABS
ORAL_TABLET | ORAL | Status: AC
Start: 2020-06-23 — End: 2020-06-24
  Filled 2020-06-23: qty 1

## 2020-06-23 MED ORDER — DOCUSATE SODIUM 100 MG PO CAPS
100.0000 mg | ORAL_CAPSULE | Freq: Two times a day (BID) | ORAL | Status: DC
  Administered 2020-06-23 – 2020-06-24 (×2): 100 mg via ORAL
  Filled 2020-06-23 (×2): qty 1

## 2020-06-23 MED ORDER — DEXAMETHASONE SODIUM PHOSPHATE 10 MG/ML IJ SOLN
INTRAMUSCULAR | Status: DC | PRN
  Administered 2020-06-23: 8 mg via INTRAVENOUS

## 2020-06-23 MED ORDER — FENTANYL CITRATE (PF) 100 MCG/2ML IJ SOLN: 25.0000 ug | INTRAMUSCULAR | Status: DC | PRN

## 2020-06-23 MED ORDER — AMISULPRIDE (ANTIEMETIC) 5 MG/2ML IV SOLN: 10.0000 mg | Freq: Once | INTRAVENOUS | Status: DC | PRN

## 2020-06-23 MED ORDER — LACTATED RINGERS IV BOLUS
500.0000 mL | Freq: Once | INTRAVENOUS | Status: AC
  Administered 2020-06-23: 500 mL via INTRAVENOUS

## 2020-06-23 MED ORDER — OXYCODONE HCL 5 MG PO TABS
5.0000 mg | ORAL_TABLET | Freq: Once | ORAL | Status: AC | PRN
  Administered 2020-06-23: 5 mg via ORAL

## 2020-06-23 MED ORDER — BUPIVACAINE-EPINEPHRINE 0.25% -1:200000 IJ SOLN
INTRAMUSCULAR | Status: DC | PRN
  Administered 2020-06-23: 30 mL

## 2020-06-23 MED ORDER — ONDANSETRON HCL 4 MG/2ML IJ SOLN
INTRAMUSCULAR | Status: DC | PRN
  Administered 2020-06-23: 4 mg via INTRAVENOUS

## 2020-06-23 MED ORDER — MIDAZOLAM HCL 2 MG/2ML IJ SOLN
INTRAMUSCULAR | Status: AC
  Filled 2020-06-23: qty 2

## 2020-06-23 MED ORDER — OXYCODONE HCL 5 MG PO TABS
10.0000 mg | ORAL_TABLET | ORAL | Status: DC | PRN
  Filled 2020-06-23: qty 2

## 2020-06-23 MED ORDER — LEVOTHYROXINE SODIUM 50 MCG PO TABS
175.0000 ug | ORAL_TABLET | Freq: Every day | ORAL | Status: DC
  Administered 2020-06-24: 175 ug via ORAL
  Filled 2020-06-23: qty 1

## 2020-06-23 MED ORDER — ORAL CARE MOUTH RINSE: 15.0000 mL | Freq: Once | OROMUCOSAL | Status: AC

## 2020-06-23 MED ORDER — LACTATED RINGERS IV BOLUS
250.0000 mL | Freq: Once | INTRAVENOUS | Status: AC
  Administered 2020-06-23: 250 mL via INTRAVENOUS

## 2020-06-23 MED ORDER — METHOCARBAMOL 500 MG PO TABS
500.0000 mg | ORAL_TABLET | Freq: Four times a day (QID) | ORAL | Status: DC | PRN
  Administered 2020-06-24: 500 mg via ORAL
  Filled 2020-06-23: qty 1

## 2020-06-23 MED ORDER — CHLORHEXIDINE GLUCONATE 0.12 % MT SOLN
15.0000 mL | Freq: Once | OROMUCOSAL | Status: AC
  Administered 2020-06-23: 15 mL via OROMUCOSAL

## 2020-06-23 MED ORDER — ACETAMINOPHEN 10 MG/ML IV SOLN: 1000.0000 mg | Freq: Once | INTRAVENOUS | Status: DC | PRN

## 2020-06-23 MED ORDER — OXYCODONE HCL 5 MG PO TABS
5.0000 mg | ORAL_TABLET | ORAL | 0 refills | Status: DC | PRN
Start: 2020-06-23 — End: 2020-07-28

## 2020-06-23 MED ORDER — OXYCODONE HCL 5 MG PO TABS
5.0000 mg | ORAL_TABLET | ORAL | Status: DC | PRN
  Administered 2020-06-24 (×2): 10 mg via ORAL
  Filled 2020-06-23: qty 2

## 2020-06-23 MED ORDER — PROPOFOL 10 MG/ML IV BOLUS
INTRAVENOUS | Status: DC | PRN
  Administered 2020-06-23: 20 mg via INTRAVENOUS

## 2020-06-23 MED ORDER — PANTOPRAZOLE SODIUM 40 MG PO TBEC
40.0000 mg | DELAYED_RELEASE_TABLET | Freq: Every day | ORAL | Status: DC
  Administered 2020-06-23 – 2020-06-24 (×2): 40 mg via ORAL
  Filled 2020-06-23 (×2): qty 1

## 2020-06-23 MED ORDER — MIDAZOLAM HCL 2 MG/2ML IJ SOLN
INTRAMUSCULAR | Status: DC | PRN
  Administered 2020-06-23: 2 mg via INTRAVENOUS

## 2020-06-23 SURGICAL SUPPLY — 42 items
BAG ZIPLOCK 12X15 (MISCELLANEOUS) IMPLANT
BENZOIN TINCTURE PRP APPL 2/3 (GAUZE/BANDAGES/DRESSINGS) ×6 IMPLANT
BLADE SAW SGTL 18X1.27X75 (BLADE) ×2 IMPLANT
BLADE SAW SGTL 18X1.27X75MM (BLADE) ×1
CLOSURE WOUND 1/2 X4 (GAUZE/BANDAGES/DRESSINGS)
COVER PERINEAL POST (MISCELLANEOUS) ×3 IMPLANT
COVER SURGICAL LIGHT HANDLE (MISCELLANEOUS) ×3 IMPLANT
COVER WAND RF STERILE (DRAPES) ×3 IMPLANT
CUP SECTOR GRIPTON 50MM (Cup) ×3 IMPLANT
DRAPE STERI IOBAN 125X83 (DRAPES) ×3 IMPLANT
DRAPE U-SHAPE 47X51 STRL (DRAPES) ×6 IMPLANT
DRESSING AQUACEL AG SP 3.5X10 (GAUZE/BANDAGES/DRESSINGS) ×1 IMPLANT
DRSG AQUACEL AG ADV 3.5X10 (GAUZE/BANDAGES/DRESSINGS) ×3 IMPLANT
DRSG AQUACEL AG SP 3.5X10 (GAUZE/BANDAGES/DRESSINGS) ×3
DURAPREP 26ML APPLICATOR (WOUND CARE) ×3 IMPLANT
ELECT REM PT RETURN 15FT ADLT (MISCELLANEOUS) ×3 IMPLANT
GAUZE XEROFORM 1X8 LF (GAUZE/BANDAGES/DRESSINGS) ×3 IMPLANT
GLOVE BIO SURGEON STRL SZ7.5 (GLOVE) ×3 IMPLANT
GLOVE BIOGEL PI IND STRL 8 (GLOVE) ×2 IMPLANT
GLOVE BIOGEL PI INDICATOR 8 (GLOVE) ×4
GLOVE ECLIPSE 8.0 STRL XLNG CF (GLOVE) ×3 IMPLANT
GOWN STRL REUS W/TWL XL LVL3 (GOWN DISPOSABLE) ×6 IMPLANT
HANDPIECE INTERPULSE COAX TIP (DISPOSABLE) ×2
HEAD FEM STD 32X+1 STRL (Hips) ×3 IMPLANT
HOLDER FOLEY CATH W/STRAP (MISCELLANEOUS) ×3 IMPLANT
KIT TURNOVER KIT A (KITS) IMPLANT
LINER ACETABULAR 32X50 (Liner) ×3 IMPLANT
PACK ANTERIOR HIP CUSTOM (KITS) ×3 IMPLANT
PENCIL SMOKE EVACUATOR (MISCELLANEOUS) ×3 IMPLANT
SCREW PINN CAN 6.5X20 (Screw) ×3 IMPLANT
SET HNDPC FAN SPRY TIP SCT (DISPOSABLE) ×1 IMPLANT
STAPLER VISISTAT 35W (STAPLE) ×3 IMPLANT
STEM CORAIL KLA10 (Stem) ×3 IMPLANT
STRIP CLOSURE SKIN 1/2X4 (GAUZE/BANDAGES/DRESSINGS) IMPLANT
SUT ETHIBOND NAB CT1 #1 30IN (SUTURE) ×3 IMPLANT
SUT ETHILON 2 0 PS N (SUTURE) IMPLANT
SUT MNCRL AB 4-0 PS2 18 (SUTURE) IMPLANT
SUT VIC AB 0 CT1 36 (SUTURE) ×3 IMPLANT
SUT VIC AB 1 CT1 36 (SUTURE) ×3 IMPLANT
SUT VIC AB 2-0 CT1 27 (SUTURE) ×4
SUT VIC AB 2-0 CT1 TAPERPNT 27 (SUTURE) ×2 IMPLANT
TRAY FOLEY MTR SLVR 16FR STAT (SET/KITS/TRAYS/PACK) IMPLANT

## 2020-06-23 NOTE — Anesthesia Procedure Notes (Signed)
Procedure Name: MAC Date/Time: 06/23/2020 11:57 AM Performed by: Niel Hummer, CRNA Pre-anesthesia Checklist: Patient identified, Emergency Drugs available, Suction available and Patient being monitored Oxygen Delivery Method: Simple face mask

## 2020-06-23 NOTE — H&P (Signed)
TOTAL HIP ADMISSION H&P  Patient is admitted for right total hip arthroplasty.  Subjective:  Chief Complaint: right hip pain  HPI: Carla Mckee, 59 y.o. female, has a history of pain and functional disability in the right hip(s) due to arthritis and patient has failed non-surgical conservative treatments for greater than 12 weeks to include NSAID's and/or analgesics, corticosteriod injections, flexibility and strengthening excercises, use of assistive devices, weight reduction as appropriate and activity modification.  Onset of symptoms was gradual starting 4 years ago with gradually worsening course since that time.The patient noted no past surgery on the right hip(s).  Patient currently rates pain in the right hip at 10 out of 10 with activity. Patient has night pain, worsening of pain with activity and weight bearing, trendelenberg gait, pain that interfers with activities of daily living and pain with passive range of motion. Patient has evidence of subchondral cysts, subchondral sclerosis, periarticular osteophytes and joint space narrowing by imaging studies. This condition presents safety issues increasing the risk of falls.  There is no current active infection.  Patient Active Problem List   Diagnosis Date Noted  . Primary osteoarthritis of right hip 04/22/2020  . Primary osteoarthritis of left hip 04/22/2020   Past Medical History:  Diagnosis Date  . Arthritis   . GERD (gastroesophageal reflux disease)   . Hypothyroidism     Past Surgical History:  Procedure Laterality Date  . CESAREAN SECTION     (2) C-sections  . CHOLECYSTECTOMY      Current Facility-Administered Medications  Medication Dose Route Frequency Provider Last Rate Last Admin  . ceFAZolin (ANCEF) IVPB 2g/100 mL premix  2 g Intravenous On Call to OR Kirtland Bouchard, PA-C      . lactated ringers infusion   Intravenous Continuous Elmer Picker, MD 10 mL/hr at 06/23/20 1043 New Bag at 06/23/20 1043  .  tranexamic acid (CYKLOKAPRON) IVPB 1,000 mg  1,000 mg Intravenous To OR Kirtland Bouchard, PA-C       No Known Allergies  Social History   Tobacco Use  . Smoking status: Never Smoker  . Smokeless tobacco: Never Used  Substance Use Topics  . Alcohol use: Not Currently    History reviewed. No pertinent family history.   Review of Systems  Musculoskeletal: Positive for gait problem and joint swelling.  All other systems reviewed and are negative.   Objective:  Physical Exam Vitals reviewed.  Constitutional:      Appearance: Normal appearance.  HENT:     Head: Normocephalic and atraumatic.  Eyes:     Extraocular Movements: Extraocular movements intact.     Pupils: Pupils are equal, round, and reactive to light.  Cardiovascular:     Rate and Rhythm: Normal rate and regular rhythm.  Pulmonary:     Effort: Pulmonary effort is normal.     Breath sounds: Normal breath sounds.  Abdominal:     Palpations: Abdomen is soft.  Musculoskeletal:     Cervical back: Normal range of motion.     Right hip: Tenderness and bony tenderness present. Decreased range of motion. Decreased strength.  Neurological:     Mental Status: She is alert and oriented to person, place, and time.  Psychiatric:        Behavior: Behavior normal.     Vital signs in last 24 hours: Temp:  [98 F (36.7 C)] 98 F (36.7 C) (09/07 1009) Pulse Rate:  [91] 91 (09/07 1009) Resp:  [17] 17 (09/07 1009) BP: (153)/(86) 153/86 (  09/07 1009) SpO2:  [99 %] 99 % (09/07 1009) Weight:  [85.4 kg] 85.4 kg (09/07 1038)  Labs:   Estimated body mass index is 36.77 kg/m as calculated from the following:   Height as of this encounter: 5' (1.524 m).   Weight as of this encounter: 85.4 kg.   Imaging Review Plain radiographs demonstrate severe degenerative joint disease of the right hip(s). The bone quality appears to be good for age and reported activity level.      Assessment/Plan:  End stage arthritis, right  hip(s)  The patient history, physical examination, clinical judgement of the provider and imaging studies are consistent with end stage degenerative joint disease of the right hip(s) and total hip arthroplasty is deemed medically necessary. The treatment options including medical management, injection therapy, arthroscopy and arthroplasty were discussed at length. The risks and benefits of total hip arthroplasty were presented and reviewed. The risks due to aseptic loosening, infection, stiffness, dislocation/subluxation,  thromboembolic complications and other imponderables were discussed.  The patient acknowledged the explanation, agreed to proceed with the plan and consent was signed. Patient is being admitted for inpatient treatment for surgery, pain control, PT, OT, prophylactic antibiotics, VTE prophylaxis, progressive ambulation and ADL's and discharge planning.The patient is planning to be discharged home with home health services    Patient's anticipated LOS is less than 2 midnights, meeting these requirements: - Younger than 67 - Lives within 1 hour of care - Has a competent adult at home to recover with post-op recover - NO history of  - Chronic pain requiring opiods  - Diabetes  - Coronary Artery Disease  - Heart failure  - Heart attack  - Stroke  - DVT/VTE  - Cardiac arrhythmia  - Respiratory Failure/COPD  - Renal failure  - Anemia  - Advanced Liver disease

## 2020-06-23 NOTE — Transfer of Care (Signed)
Immediate Anesthesia Transfer of Care Note  Patient: Carla Mckee  Procedure(s) Performed: RIGHT TOTAL HIP ARTHROPLASTY ANTERIOR APPROACH (Right Hip)  Patient Location: PACU  Anesthesia Type:Spinal  Level of Consciousness: awake  Airway & Oxygen Therapy: Patient Spontanous Breathing and Patient connected to face mask oxygen  Post-op Assessment: Report given to RN and Post -op Vital signs reviewed and stable  Post vital signs: Reviewed and stable  Last Vitals:  Vitals Value Taken Time  BP    Temp    Pulse 80 06/23/20 1333  Resp 17 06/23/20 1333  SpO2 99 % 06/23/20 1333  Vitals shown include unvalidated device data.  Last Pain:  Vitals:   06/23/20 1038  TempSrc:   PainSc: 5       Patients Stated Pain Goal: 0 (06/23/20 1038)  Complications: No complications documented.

## 2020-06-23 NOTE — Anesthesia Postprocedure Evaluation (Signed)
Anesthesia Post Note  Patient: Carla Mckee  Procedure(s) Performed: RIGHT TOTAL HIP ARTHROPLASTY ANTERIOR APPROACH (Right Hip)     Patient location during evaluation: PACU Anesthesia Type: Spinal Level of consciousness: awake and alert Pain management: pain level controlled Vital Signs Assessment: post-procedure vital signs reviewed and stable Respiratory status: spontaneous breathing, respiratory function stable and patient connected to nasal cannula oxygen Cardiovascular status: blood pressure returned to baseline and stable Postop Assessment: no headache, no backache, no apparent nausea or vomiting and spinal receding Anesthetic complications: no   No complications documented.  Last Vitals:  Vitals:   06/23/20 1800 06/23/20 1832  BP: (!) 159/69 (!) 159/69  Pulse: 92 (!) 105  Resp: 16   Temp: 36.6 C   SpO2: 99% 97%    Last Pain:  Vitals:   06/23/20 1800  TempSrc:   PainSc: 5                  Carla Mckee P [name]

## 2020-06-23 NOTE — Anesthesia Procedure Notes (Signed)
Spinal  Patient location during procedure: OR Start time: 06/23/2020 12:00 PM End time: 06/23/2020 12:05 PM Staffing Performed: anesthesiologist  Anesthesiologist: Leonides Grills, MD Preanesthetic Checklist Completed: patient identified, IV checked, risks and benefits discussed, surgical consent, monitors and equipment checked, pre-op evaluation and timeout performed Spinal Block Patient position: sitting Prep: DuraPrep Patient monitoring: cardiac monitor, continuous pulse ox and blood pressure Approach: midline Location: L4-5 Injection technique: single-shot Needle Needle type: Pencan  Needle gauge: 24 G Needle length: 9 cm Assessment Sensory level: T10 Additional Notes Functioning IV was confirmed and monitors were applied. Sterile prep and drape, including hand hygiene and sterile gloves were used. The patient was positioned and the spine was prepped. The skin was anesthetized with lidocaine. Previous unsuccessful attempts by CRNA. Free flow of clear CSF was obtained prior to injecting local anesthetic into the CSF.  The spinal needle aspirated freely following injection.  The needle was carefully withdrawn.  The patient tolerated the procedure well.

## 2020-06-23 NOTE — Evaluation (Signed)
Physical Therapy Evaluation Patient Details Name: Carla Mckee MRN: [id number] DOB: 1961/03/05 Today's Date: 06/23/2020   History of Present Illness  Patient is 59 y.o. female s/p Rt THA anterior approach on 06/23/20 with PMH significant for GERD, hypothyroidism ,OA.  Clinical Impression  Carla Mckee is a 59 y.o. female POD 0 s/p Rt THA. Patient reports independence with RW due to pain with mobility. Patient is now limited by functional impairments (see PT problem list below) and requires Mod assist for bed mobility and transfers with RW this session. She was greatly limited with mobility due to abdominal pain. Pt assist to [facility] this session but unable to void bladder. RN notified and pt returned to supine in bed for bladder scan. Patient will benefit from continued skilled PT interventions to address impairments and progress towards PLOF. Acute PT will follow to progress mobility and stair training in preparation for safe discharge home.     Follow Up Recommendations Follow surgeon's recommendation for DC plan and follow-up therapies    Equipment Recommendations  Rolling walker with 5" wheels;3in1 (PT)    Recommendations for Other Services       Precautions / Restrictions Precautions Precautions: Fall Restrictions Weight Bearing Restrictions: No Other Position/Activity Restrictions: WBAT      Mobility  Bed Mobility Overal bed mobility: Needs Assistance Bed Mobility: Supine to Sit;Sit to Supine     Supine to sit: Mod assist;HOB elevated Sit to supine: HOB elevated;Mod assist   General bed mobility comments: Pt limited greatly by abdominal pain, Mod assist required to raise trunk upright and steady EOB. Mod assist to raise LE's into bed and return to supine for bladder scan.   Transfers Overall transfer level: Needs assistance Equipment used: Rolling walker (2 wheeled) Transfers: Sit to/from [employer] Sit to Stand: Min assist Stand pivot transfers: Min  assist       General transfer comment: cues for safe technique with RW, and assist to position it for turn. Cues for step pattern with stand pivot/step.   Ambulation/Gait         Stairs       Wheelchair Mobility    Modified Rankin (Stroke Patients Only)       Balance Overall balance assessment: Needs assistance Sitting-balance support: Feet supported Sitting balance-Leahy Scale: Fair     Standing balance support: During functional activity;Bilateral upper extremity supported Standing balance-Leahy Scale: Fair              Pertinent Vitals/Pain Pain Assessment: 0-10 Pain Score: 5  Pain Location: bladder  Pain Descriptors / Indicators: Discomfort Pain Intervention(s): Limited activity within patient's tolerance;Monitored during session;Repositioned (RN/NT doing bladder scan)    Home Living Family/patient expects to be discharged to:: Private residence Living Arrangements: Alone Available Help at Discharge: Family Type of Home: House Home Access: Stairs to enter Entrance Stairs-Rails: Right Entrance Stairs-Number of Steps: 3+1 Home Layout: One level Home Equipment: Walker - 2 wheels;Grab bars - tub/shower;Cane - single point Additional Comments: pt's neice will stay with her for a week    Prior Function Level of Independence: Independent;Independent with assistive device(s)         Comments: using RW for about 1 month prior to surgery     Hand Dominance   Dominant Hand: Right    Extremity/Trunk Assessment   Upper Extremity Assessment Upper Extremity Assessment: Overall WFL for tasks assessed    Lower Extremity Assessment Lower Extremity Assessment: Overall WFL for tasks assessed    Cervical / Trunk Assessment Cervical /  Trunk Assessment: Normal  Communication   Communication: No difficulties  Cognition Arousal/Alertness: Awake/alert Behavior During Therapy: WFL for tasks assessed/performed Overall Cognitive Status: Within Functional  Limits for tasks assessed               General Comments      Exercises     Assessment/Plan    PT Assessment Patient needs continued PT services  PT Problem List Decreased strength;Decreased range of motion;Decreased activity tolerance;Decreased balance;Decreased mobility;Decreased knowledge of use of DME;Decreased knowledge of precautions       PT Treatment Interventions DME instruction;Gait training;Stair training;Functional mobility training;Therapeutic activities;Therapeutic exercise;Balance training;Patient/family education    PT Goals (Current goals can be found in the Care Plan section)  Acute Rehab PT Goals Patient Stated Goal: go to the bathroom PT Goal Formulation: With patient Time For Goal Achievement: 06/30/20 Potential to Achieve Goals: Good    Frequency 7X/week    AM-PAC PT "6 Clicks" Mobility  Outcome Measure Help needed turning from your back to your side while in a flat bed without using bedrails?: A Lot Help needed moving from lying on your back to sitting on the side of a flat bed without using bedrails?: A Lot Help needed moving to and from a bed to a chair (including a wheelchair)?: A Little Help needed standing up from a chair using your arms (e.g., wheelchair or bedside chair)?: A Little Help needed to walk in hospital room?: A Little Help needed climbing 3-5 steps with a railing? : A Lot 6 Click Score: 15    End of Session Equipment Utilized During Treatment: Gait belt Activity Tolerance: Patient tolerated treatment well Patient left: in bed;with call bell/phone within reach;with nursing/sitter in room Nurse Communication: Mobility status;Other (comment) (bladder scan) PT Visit Diagnosis: Muscle weakness (generalized) (M62.81);Difficulty in walking, not elsewhere classified (R26.2)    Time: 4431-5400 PT Time Calculation (min) (ACUTE ONLY): 18 min   Charges:   PT Evaluation $PT Eval Low Complexity: 1 Low         Wynn Maudlin,  DPT Acute Rehabilitation Services  Office [phone] Pager [phone]  06/23/2020 5:22 PM

## 2020-06-23 NOTE — Brief Op Note (Signed)
06/23/2020  1:12 PM  PATIENT:  Carla Mckee  59 y.o. female  PRE-OPERATIVE DIAGNOSIS:  osteoarthritis right hip  POST-OPERATIVE DIAGNOSIS:  osteoarthritis right hip  PROCEDURE:  Procedure(s): RIGHT TOTAL HIP ARTHROPLASTY ANTERIOR APPROACH (Right)  SURGEON:  Surgeon(s) and Role:    Kathryne Hitch, MD - Primary  PHYSICIAN ASSISTANT:  Rexene Edison, PA_C  ANESTHESIA:   spinal  EBL:  100 mL   COUNTS:  YES  DICTATION: .Other Dictation: Dictation Number [id number]  PLAN OF CARE: Discharge to home after PACU  PATIENT DISPOSITION:  PACU - hemodynamically stable.   Delay start of Pharmacological VTE agent (>24hrs) due to surgical blood loss or risk of bleeding: no

## 2020-06-23 NOTE — Evaluation (Signed)
Physical Therapy Evaluation Patient Details Name: Carla Mckee MRN: [id number] DOB: May 17, 1961 Today's Date: 06/23/2020   History of Present Illness  Patient is 59 y.o. female s/p Rt THA anterior approach on 06/23/20 with PMH significant for GERD, hypothyroidism ,OA.    Clinical Impression  Patient seen for follow up session today in effort to meet goals to discharge day of surgery. Patient is much improved after I&O cath to empty bladder. Patient now requires min guard/supervision for transfers and gait with RW. Patient was able to ambulate ~140 feet with RW and min guard/supervision and cues for safe walker management. Patient educated on safe sequencing for stair mobility and verbalized safe guarding position for people assisting with mobility. Patient instructed in exercises to facilitate ROM and circulation. Patient will benefit from continued skilled PT interventions to address impairments and progress towards PLOF. Patient has met mobility goals at adequate level for discharge home; will continue to follow if pt continues acute stay to progress towards Mod I goals.     Follow Up Recommendations Follow surgeons recommendation for DC plan and follow-up therapies    Equipment Recommendations  Rolling walker with 5" wheels;3in1 (PT)    Recommendations for Other Services       Precautions / Restrictions Precautions Precautions: Fall Restrictions Weight Bearing Restrictions: No Other Position/Activity Restrictions: WBAT      Mobility  Bed Mobility Overal bed mobility: Needs Assistance Bed Mobility: Supine to Sit;Sit to Supine     Supine to sit: Min guard Sit to supine: Min guard   General bed mobility comments: pt much improved, no assist required to sit up, pt taking some extra time to sit up. Educated on use of gait belt to raise Rt LE into bed.   Transfers Overall transfer level: Needs assistance Equipment used: Rolling walker (2 wheeled) Transfers: Sit to/from  [employer] Sit to Stand: Supervision Stand pivot transfers: Min assist       General transfer comment: pt with good hand placement on RW and no assist required to rise from EOB. pt with safe reach back technique.  Ambulation/Gait Ambulation/Gait assistance: Min guard;Supervision Gait Distance (Feet): 140 Feet Assistive device: Rolling walker (2 wheeled) Gait Pattern/deviations: Step-to pattern;Decreased stance time - right;Decreased weight shift to right;Decreased stride length Gait velocity: decr   General Gait Details: cues for safe step pattern and proximity with RW, no overt LOB noted.   Stairs Stairs: Yes Stairs assistance: Min guard Stair Management: One rail Right;Step to pattern;Forwards;With cane Number of Stairs: 3 General stair comments: cues for safe step pattern, "up with good, down with bad" no overt LOB noted.  Wheelchair Mobility    Modified Rankin (Stroke Patients Only)       Balance Overall balance assessment: Needs assistance Sitting-balance support: Feet supported Sitting balance-Leahy Scale: Fair     Standing balance support: During functional activity;Bilateral upper extremity supported Standing balance-Leahy Scale: Fair              Pertinent Vitals/Pain Pain Assessment: 0-10 Pain Score: 1  Pain Location: Rt hip Pain Descriptors / Indicators: Discomfort Pain Intervention(s): Limited activity within patient's tolerance;Monitored during session;Repositioned;Ice applied    Home Living Family/patient expects to be discharged to:: Private residence Living Arrangements: Alone Available Help at Discharge: Family Type of Home: House Home Access: Stairs to enter Entrance Stairs-Rails: Right Entrance Stairs-Number of Steps: 3+1 Home Layout: One level Home Equipment: Walker - 2 wheels;Grab bars - tub/shower;Cane - single point Additional Comments: pt's neice will stay with her for  a week    Prior Function Level of  Independence: Independent;Independent with assistive device(s)         Comments: using RW for about 1 month prior to surgery     Hand Dominance   Dominant Hand: Right    Extremity/Trunk Assessment   Upper Extremity Assessment Upper Extremity Assessment: Overall WFL for tasks assessed    Lower Extremity Assessment Lower Extremity Assessment: Overall WFL for tasks assessed    Cervical / Trunk Assessment Cervical / Trunk Assessment: Normal  Communication   Communication: No difficulties  Cognition Arousal/Alertness: Awake/alert Behavior During Therapy: WFL for tasks assessed/performed Overall Cognitive Status: Within Functional Limits for tasks assessed           General Comments      Exercises Total Joint Exercises Ankle Circles/Pumps: AROM;Both;10 reps;Supine Quad Sets: AROM;Right;5 reps;Supine Short Arc Quad: AROM;Right;Other reps (comment);Supine (3) Heel Slides: AROM;Right;Other reps (comment);Supine (3)   Assessment/Plan    PT Assessment Patient needs continued PT services  PT Problem List Decreased strength;Decreased range of motion;Decreased activity tolerance;Decreased balance;Decreased mobility;Decreased knowledge of use of DME;Decreased knowledge of precautions       PT Treatment Interventions DME instruction;Gait training;Stair training;Functional mobility training;Therapeutic activities;Therapeutic exercise;Balance training;Patient/family education    PT Goals (Current goals can be found in the Care Plan section)  Acute Rehab PT Goals Patient Stated Goal: get better to get home PT Goal Formulation: With patient Time For Goal Achievement: 06/30/20 Potential to Achieve Goals: Good    Frequency 7X/week    AM-PAC PT "6 Clicks" Mobility  Outcome Measure Help needed turning from your back to your side while in a flat bed without using bedrails?: None Help needed moving from lying on your back to sitting on the side of a flat bed without using  bedrails?: A Little Help needed moving to and from a bed to a chair (including a wheelchair)?: A Little Help needed standing up from a chair using your arms (e.g., wheelchair or bedside chair)?: A Little Help needed to walk in hospital room?: A Little Help needed climbing 3-5 steps with a railing? : A Little 6 Click Score: 19    End of Session Equipment Utilized During Treatment: Gait belt Activity Tolerance: Patient tolerated treatment well Patient left: in bed;with call bell/phone within reach;with nursing/sitter in room Nurse Communication: Mobility status;Other (comment) (bladder scan) PT Visit Diagnosis: Muscle weakness (generalized) (M62.81);Difficulty in walking, not elsewhere classified (R26.2)    Time: 3810-1751 PT Time Calculation (min) (ACUTE ONLY): 30 min   Charges:   PT Treatments $Gait Training: 8-22 mins $Therapeutic Exercise: 8-22 mins       Verner Mould, DPT Acute Rehabilitation Services  Office [phone] Pager [phone]  06/23/2020 6:39 PM

## 2020-06-23 NOTE — Discharge Instructions (Signed)

## 2020-06-23 NOTE — Plan of Care (Signed)
Plan of care reviewed and discussed with the patient. 

## 2020-06-23 NOTE — Progress Notes (Signed)
Patient unable to void post-op after spinal block.  Used bladder scanner and patient had in bladder. In/ Out cath performed with out. Dr. Magnus Ivan aware. Patient will need to be able to void on her own before discharge. Dr. Magnus Ivan would like to wait 30 more minutes and then will need to put in orders for her to stay the night if still unable to pee. Will report to oncoming RN.  Lupe Carney, RN

## 2020-06-23 NOTE — Op Note (Signed)
Carla Mckee, Carla Mckee MEDICAL RECORD OA:[id number] ACCOUNT [id number] DATE OF BIRTH:08-08-1961 FACILITY: [facility] LOCATION: [facility] PHYSICIAN:Jasiel Apachito Aretha Parrot, MD  OPERATIVE REPORT  DATE OF PROCEDURE:  06/23/2020  PREOPERATIVE DIAGNOSIS:  Primary osteoarthritis and degenerative joint disease, right hip.  POSTOPERATIVE DIAGNOSIS:  Primary osteoarthritis and degenerative joint disease, right hip.  PROCEDURE:  Right total hip arthroplasty through direct anterior approach.  IMPLANTS:  DePuy Sector Gription acetabular component size 50, size 32+0 neutral polyethylene liner with a single screw, size 10 Corail femoral component with varus offset, size 32+1 metal hip ball.  SURGEON:  Vanita Panda.  Magnus Ivan, MD  ASSISTANT:  Richardean Canal, PA-C.  ANESTHESIA:  Spinal.  ANTIBIOTICS:  Two g IV Ancef.  ESTIMATED BLOOD LOSS:  100 mL.  COMPLICATIONS:  None.  INDICATIONS:  The patient is an active 59 year old female with debilitating arthritis involving both of her hips with the right much worse than the left.  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  At this  point, her pain is daily and has detrimentally affected her mobility her quality of life, and her activities of daily living.  She has lost a significant amount of weight and BMI is down into the mid 30s.  At this point, she does wish to proceed with a  total hip arthroplasty and I agree with this.  Her right hip pain has detrimentally affected her mobility, her quality of life and activities of daily living.  She understands with this type of surgery, there is at risk of acute blood loss anemia, nerve  or vessel injury, fracture, infection, DVT, dislocation and implant failure.  She understands our goals are to decrease pain, improve mobility and overall improve quality of life.  DESCRIPTION OF PROCEDURE:  After informed consent was obtained, the appropriate right hip was marked.   She was brought to the operating room and sat up on a stretcher where spinal anesthesia was obtained.  She was laid in the supine position on the  stretcher.  I assessed her leg lengths and found her to be short on the right side.  Traction boots were placed on both her feet.  Next, she was placed supine on the Hana fracture table with the perineal post in place and both legs in in-line skeletal  traction device and no traction applied.  Her right operative hip was prepped and draped with DuraPrep and sterile drapes.  A time-out was called and she was identified as correct patient, correct right hip.  I then made an incision just inferior and  posterior to the anterior defect spine and carried this obliquely down the leg.  I dissected down to the tensor fascia lata muscle.  Tensor fascia was then divided longitudinally to proceed with direct anterior approach to the hip.  We identified and  cauterized circumflex vessels and identified the hip capsule, opened up the hip capsule in an L-type format finding a moderate joint effusion and significant periarticular osteophytes around the lateral femoral head and neck.  I placed Cobra retractors  around the medial and lateral femoral neck and made our femoral neck cut with an oscillating saw just proximal to the lesser trochanter and completed this with osteotome.  We placed a corkscrew guide in the femoral head and removed the femoral head in  its entirety and found a wide area devoid of cartilage and flattening of the femoral head.  Of note, she was also in the superior position within the  acetabulum and without leg length discrepancy, we knew it would certainly be difficult maintaining her  original anatomy.  I placed a bent Hohmann over the medial acetabular rim and removed remnants of the acetabular labrum and other debris.  I then began reaming under direct visualization from a size 44 reamer in stepwise increments going up to a size 50  and I felt like we  really needed the 50 and I had several fluoroscopic images taken to make sure that we were pleased with the position of the acetabular component.  Once we were pleased with that position, we placed the real DePuy Sector Gription  acetabular component size 50, had a nice fit to it, but we still placed a single screw and I went with a 32+0 neutral polyethylene liner.  Attention was then turned to the femur.  With the leg externally rotated to 120 degrees, extended and adducted, we  were able to place a Mueller retractor medially and a Hohmann retractor behind the greater trochanter.  We released the lateral joint capsule and used a box-cutting osteotome to enter the femoral canal and a rongeur to lateralize.  We then began  broaching using the Corail broaching system from a size 8 going up to a size 10.  With a size 10 in place, we trialed a standard offset femoral neck and a 32+1 hip ball.  It was very difficult to reduce in the pelvis and we felt like we would actually up  to go with a valgus offset femoral neck with shortness.  We removed the standard neck and with a varus offset femoral neck and a 32+1 hip ball, reduced this in the acetabulum and we were pleased with range of motion, offset and stability as well as leg  length assessed mechanically and radiographically.  We then dislocated the hip and removed the trial components.  I placed the real Corail femoral component size 10 with varus offset and the real 32+1 metal hip ball and again reduced this in the  acetabulum and we were pleased with stability assessed radiographically and mechanically.  We then irrigated the soft tissue with normal saline solution using pulsatile lavage.  I closed the joint capsule with interrupted #1 Ethibond suture, followed by  closing the tensor fascia with #1 Vicryl, 0 Vicryl was used to close deep tissue and 2-0 Vicryl was used to close subcutaneous tissue.  The skin was reapproximated with staples.  At the end of the  case and in and out catheterization was performed.  She  was then taken off the Hana table and taken to the recovery room in stable condition.  All final counts were correct.  There were no complications noted.  Of note, Rexene Edison, PA-C, assisted in the entire case.  His assistance was crucial for facilitating  all aspects of this case.  VN/NUANCE  D:06/23/2020 T:06/23/2020 JOB:[id number]

## 2020-06-24 ENCOUNTER — Encounter (HOSPITAL_COMMUNITY): Payer: Self-pay | Admitting: Orthopaedic Surgery

## 2020-06-24 NOTE — TOC Transition Note (Signed)
Transition of Care [facility]) - CM/SW Discharge Note   Patient Details  Name: Carla Mckee MRN: [id number] Date of Birth: [birth year]  [facility] of [facility] CM/SW Contact:  Lennart Pall, LCSW Phone Number: 06/24/2020, 10:32 AM   Clinical Narrative:    Met with pt this morning to review dc needs.  DME delivered to pt yesterday and in room.  She is aware of HHPT orders and requests referral be placed with [facility].  Discussed with her the issue of being uninsured and that she will have to complete "charity paperwork" with [employer] [facility] or pay for visits privately.  She fully understands this and had actually spoken with this [facility] prior to her surgery.  She notes that if she is unable to work out charity coverage then she plans to follow [facility].  Very pleasant woman who is ready for dc today.   Final next level of care: [place] Barriers to Discharge: No Barriers Identified   Patient Goals and CMS Choice Patient states their goals for this hospitalization and ongoing recovery are:: go home today CMS Medicare.gov Compare Post Acute Care list provided to:: Patient Choice offered to / list presented to : Patient  Discharge Placement                       Discharge Plan and Services                DME Arranged: 3-N-1, Walker rolling DME Agency: [employer] Date DME Agency Contacted: 06/23/20   Representative spoke with at DME Agency: [place]: PT [place]: [place] Date [place]: 06/24/20 Time [place]: 1032 Representative spoke with at [place]: [place] ([place]) Interventions     Readmission Risk Interventions Readmission Risk Prevention Plan 06/24/2020  Post Dischage Appt Complete  Medication Screening Complete  Transportation Screening Complete

## 2020-06-24 NOTE — Progress Notes (Signed)
Patient ID: Carla Mckee, female   DOB: 10/22/1960, 59 y.o.   MRN: [id number] She is comfortable this morning.  Was admitted last evening due to urinary retention.  That has resolved.  Her vitals are stable and her right hip is stable.  Can be discharge to home today.

## 2020-06-24 NOTE — Discharge Summary (Signed)
Patient ID: Carla Mckee MRN: [id number] DOB/AGE: 60/12/1960 60 y.o.  Admit date: 06/23/2020 Discharge date: 06/24/2020  Admission Diagnoses:  Principal Problem:   Primary osteoarthritis of right hip Active Problems:   Osteoarthritis   Status post total replacement of right hip   Discharge Diagnoses:  Same  Past Medical History:  Diagnosis Date  . Arthritis   . GERD (gastroesophageal reflux disease)   . Hypothyroidism     Surgeries: Procedure(s): RIGHT TOTAL HIP ARTHROPLASTY ANTERIOR APPROACH on 06/23/2020   Consultants:   Discharged Condition: Improved  Hospital Course: Lindy Garczynski is an 59 y.o. female who was admitted 06/23/2020 for operative treatment ofPrimary osteoarthritis of right hip. Patient has severe unremitting pain that affects sleep, daily activities, and work/hobbies. After pre-op clearance the patient was taken to the operating room on 06/23/2020 and underwent  Procedure(s): RIGHT TOTAL HIP ARTHROPLASTY ANTERIOR APPROACH.    Patient was given perioperative antibiotics:  Anti-infectives (From admission, onward)   Start     Dose/Rate Route Frequency Ordered Stop   06/23/20 2000  ceFAZolin (ANCEF) IVPB 1 g/50 mL premix        1 g 100 mL/hr over 30 Minutes Intravenous Every 6 hours 06/23/20 1949 06/24/20 0143   06/23/20 1015  ceFAZolin (ANCEF) IVPB 2g/100 mL premix        2 g 200 mL/hr over 30 Minutes Intravenous On call to O.R. 06/23/20 1000 06/23/20 1235       Patient was given sequential compression devices, early ambulation, and chemoprophylaxis to prevent DVT.  Patient benefited maximally from hospital stay and there were no complications.    Recent vital signs:  Patient Vitals for the past 24 hrs:  BP Temp Temp src Pulse Resp SpO2 Height Weight  06/24/20 0556 137/72 98.9 F (37.2 C) -- 98 16 97 % -- --  06/24/20 0201 133/79 98.9 F (37.2 C) Oral 82 18 95 % -- --  06/23/20 1955 (!) 165/73 98.5 F (36.9 C) Oral (!) 106 20 100 % -- --  06/23/20  1832 (!) 159/69 -- -- (!) 105 -- 97 % -- --  06/23/20 1800 (!) 159/69 97.8 F (36.6 C) -- 92 16 99 % -- --  06/23/20 1600 127/65 -- -- 95 17 99 % -- --  06/23/20 1535 136/71 98 F (36.7 C) -- 86 16 100 % -- --  06/23/20 1515 134/76 -- -- 82 14 96 % -- --  06/23/20 1500 116/71 97.9 F (36.6 C) -- 84 18 100 % -- --  06/23/20 1445 127/68 -- -- 80 17 97 % -- --  06/23/20 1430 125/70 -- -- 82 (!) 22 98 % -- --  06/23/20 1415 (!) 108/58 -- -- 84 19 99 % -- --  06/23/20 1400 116/67 -- -- 81 20 100 % -- --  06/23/20 1345 110/70 -- -- 80 18 100 % -- --  06/23/20 1333 111/63 97.7 F (36.5 C) -- 80 17 99 % -- --  06/23/20 1038 -- -- -- -- -- -- 5' (1.524 m) 85.4 kg  06/23/20 1009 (!) 153/86 98 F (36.7 C) Oral 91 17 99 % -- --     Recent laboratory studies: No results for input(s): WBC, HGB, HCT, PLT, NA, K, CL, CO2, BUN, CREATININE, GLUCOSE, INR, CALCIUM in the last 72 hours.  Invalid input(s): PT, 2   Discharge Medications:   Allergies as of 06/24/2020   No Known Allergies     Medication List    STOP taking these medications  HYDROcodone-acetaminophen 10-325 MG tablet Commonly known as: NORCO     TAKE these medications   aspirin 81 MG chewable tablet Commonly known as: Aspirin Childrens Chew 1 tablet (81 mg total) by mouth 2 (two) times daily after a meal.   diphenhydrAMINE 25 mg capsule Commonly known as: BENADRYL Take 25 mg by mouth at bedtime.   furosemide 40 MG tablet Commonly known as: LASIX Take 40 mg by mouth daily.   levothyroxine 175 MCG tablet Commonly known as: SYNTHROID Take 175 mcg by mouth daily before breakfast.   methocarbamol 500 MG tablet Commonly known as: ROBAXIN Take 1 tablet (500 mg total) by mouth every 6 (six) hours as needed for muscle spasms.   ondansetron 4 MG disintegrating tablet Commonly known as: Zofran ODT Take 1 tablet (4 mg total) by mouth every 8 (eight) hours as needed for nausea or vomiting.   oxyCODONE 5 MG immediate release  tablet Commonly known as: Oxy IR/ROXICODONE Take 1-2 tablets (5-10 mg total) by mouth every 4 (four) hours as needed for severe pain.            Durable Medical Equipment  (From admission, onward)         Start     Ordered   06/23/20 1950  DME 3 n 1  Once        06/23/20 1949   06/23/20 1950  DME Walker rolling  Once       Question Answer Comment  Walker: With 5 Inch Wheels   Patient needs a walker to treat with the following condition Status post total replacement of right hip      06/23/20 1949          Diagnostic Studies: DG Pelvis Portable  Result Date: 06/23/2020 CLINICAL DATA:  Postop right hip replacement EXAM: PORTABLE PELVIS 1-2 VIEWS COMPARISON:  X-ray hip 06/23/2020 11:58 a.m. FINDINGS: Status post total right hip arthroplasty with femoral and acetabular components in alignments. Skin staples overlie the right hip. Bones of the pelvis and left hip are unremarkable. Expected postoperative changes within the surrounding subcutaneous soft tissues. IMPRESSION: Status post total right hip arthroplasty. Electronically Signed   By: Tish Frederickson M.D.   On: 06/23/2020 15:09   DG C-Arm 1-60 Min-No Report  Result Date: 06/23/2020 Fluoroscopy was utilized by the requesting physician.  No radiographic interpretation.   DG HIP OPERATIVE UNILAT W OR W/O PELVIS RIGHT  Result Date: 06/23/2020 CLINICAL DATA:  Status post right hip replacement. EXAM: OPERATIVE right HIP (WITH PELVIS IF PERFORMED) 3 VIEWS TECHNIQUE: Fluoroscopic spot image(s) were submitted for interpretation post-operatively. Radiation exposure index: 3.2680 mGy. COMPARISON:  April 14, 2020. FINDINGS: Three intraoperative fluoroscopic images were obtained of the right hip. These demonstrate the right acetabular and femoral components to be in grossly good position. Expected postoperative changes are seen in the surrounding soft tissues. IMPRESSION: Status post right total hip arthroplasty. Electronically Signed   By:  Lupita Raider M.D.   On: 06/23/2020 13:23    Disposition: Discharge disposition: 01-Home or Self Care       Discharge Instructions    Discharge patient   Complete by: As directed    Discharge to home late today if clears therapy and nursing deems safe to be discharged.   Discharge disposition: 01-Home or Self Care   Discharge patient date: 06/23/2020       Follow-up Information    Kathryne Hitch, MD In 2 weeks.   Specialty: Orthopedic Surgery Contact information: [phone] [place]  [address] [place] [address] [phone]                Signed: Kathryne Hitch 06/24/2020, 7:13 AM

## 2020-06-24 NOTE — Progress Notes (Signed)
Physical Therapy Treatment Patient Details Name: Carla Mckee MRN: [id number] DOB: 1961/10/15 Today's Date: 06/24/2020    History of Present Illness Patient is 59 y.o. female s/p Rt THA anterior approach on 06/23/20 with PMH significant for GERD, hypothyroidism ,OA.    PT Comments    Pt continues to participate well. Minimal pain with activity. Reviewed/practiced exercises and gait training. Pt denied need to practice stair negotiation again (she felt comfortable after practice/education on yesterday evening). Pt has HEP to perform at home-she is hoping to get HHPT f/u but if that doesn't pan out, she feels comfortable with HEP. All education completed. Okay to d/c from PT standpoint.     Follow Up Recommendations  Follow surgeon's recommendation for DC plan and follow-up therapies     Equipment Recommendations  Rolling walker with 5" wheels;3in1 (PT)    Recommendations for Other Services       Precautions / Restrictions Precautions Precautions: Fall Restrictions Weight Bearing Restrictions: No Other Position/Activity Restrictions: WBAT    Mobility  Bed Mobility Overal bed mobility: Needs Assistance Bed Mobility: Supine to Sit;Sit to Supine     Supine to sit: Min guard;HOB elevated Sit to supine: Min assist   General bed mobility comments: Assist for R LE onto bed. Increased time.  Transfers Overall transfer level: Needs assistance Equipment used: Rolling walker (2 wheeled) Transfers: Sit to/from Stand Sit to Stand: Supervision         General transfer comment: VCs safety. Cues hand placement  Ambulation/Gait Ambulation/Gait assistance: Min guard Gait Distance (Feet): 135 Feet Assistive device: Rolling walker (2 wheeled) Gait Pattern/deviations: Step-through pattern;Decreased stride length;Decreased step length - right     General Gait Details: Min guard for safety. Pt denied lightheadedness. No overt LOB with RW use.   Stairs         General stair  comments: Verbally reviewed stair technique and "up with good, down with bad" sequencing. Pt denied need to practice again on today.   Wheelchair Mobility    Modified Rankin (Stroke Patients Only)       Balance                                            Cognition Arousal/Alertness: Awake/alert Behavior During Therapy: WFL for tasks assessed/performed Overall Cognitive Status: Within Functional Limits for tasks assessed                                        Exercises Total Joint Exercises Ankle Circles/Pumps: AROM;Both;10 reps Quad Sets: AROM;Both;10 reps Heel Slides: AAROM;Right;10 reps Hip ABduction/ADduction: AROM;AAROM;Right;10 reps Knee Flexion: AROM;Right;10 reps;Seated Marching in Standing: AROM;Both;10 reps General Exercises - Lower Extremity Heel Raises: AROM;Both;10 reps    General Comments        Pertinent Vitals/Pain Pain Assessment: 0-10 Pain Score: 1  Pain Location: R hip Pain Descriptors / Indicators: Discomfort;Sore;Tightness Pain Intervention(s): Monitored during session    Home Living                      Prior Function            PT Goals (current goals can now be found in the care plan section) Progress towards PT goals: Progressing toward goals    Frequency    7X/week  PT Plan Current plan remains appropriate    Co-evaluation              AM-PAC PT "6 Clicks" Mobility   Outcome Measure  Help needed turning from your back to your side while in a flat bed without using bedrails?: A Little Help needed moving from lying on your back to sitting on the side of a flat bed without using bedrails?: A Little Help needed moving to and from a bed to a chair (including a wheelchair)?: A Little Help needed standing up from a chair using your arms (e.g., wheelchair or bedside chair)?: A Little Help needed to walk in hospital room?: A Little Help needed climbing 3-5 steps with a railing?  : A Little 6 Click Score: 18    End of Session Equipment Utilized During Treatment: Gait belt Activity Tolerance: Patient tolerated treatment well Patient left: in bed;with call bell/phone within reach;with bed alarm set   PT Visit Diagnosis: Other abnormalities of gait and mobility (R26.89)     Time: 3810-1751 PT Time Calculation (min) (ACUTE ONLY): 32 min  Charges:  $Gait Training: 8-22 mins $Therapeutic Exercise: 8-22 mins                         Faye Ramsay, PT Acute Rehabilitation  Office: [phone] Pager: [phone]

## 2020-06-27 ENCOUNTER — Encounter: Payer: Self-pay | Admitting: Orthopaedic Surgery

## 2020-07-08 ENCOUNTER — Ambulatory Visit (INDEPENDENT_AMBULATORY_CARE_PROVIDER_SITE_OTHER): Payer: Self-pay | Admitting: Orthopaedic Surgery

## 2020-07-08 ENCOUNTER — Encounter: Payer: Self-pay | Admitting: Orthopaedic Surgery

## 2020-07-08 DIAGNOSIS — Z96641 Presence of right artificial hip joint: Secondary | ICD-10-CM

## 2020-07-08 NOTE — Progress Notes (Signed)
The patient is 2 weeks yesterday status post a right total hip arthroplasty through direct under approach.  She reports increased range of motion and strength and she is not taking any pain medicine at all.  She has been taking a baby aspirin twice a day.  She does feel like she may have pulled a muscle getting out of bed.  On examination I did remove the staples in place Steri-Strips around her right hip incision.  Since she has an abdomen that hangs over incision and we will have her place a dry gauze in her groin crease daily.  Her leg lengths appear equal and overall she looks good.  And have her get on an aspirin once a day for a week and then she can stop her aspirin.  We will see her back in 4 weeks to see how she is doing overall.  If there is any issues before then she will let us know.  4 weeks from now we can work on getting on the schedule for a left total hip arthroplasty and likely November.

## 2020-07-09 ENCOUNTER — Encounter: Payer: Self-pay | Admitting: Orthopaedic Surgery

## 2020-07-28 ENCOUNTER — Other Ambulatory Visit: Payer: Self-pay | Admitting: Orthopaedic Surgery

## 2020-07-28 ENCOUNTER — Encounter: Payer: Self-pay | Admitting: Orthopaedic Surgery

## 2020-07-28 MED ORDER — OXYCODONE HCL 5 MG PO TABS
5.0000 mg | ORAL_TABLET | Freq: Four times a day (QID) | ORAL | 0 refills | Status: DC | PRN
Start: 2020-07-28 — End: 2020-09-22

## 2020-08-03 ENCOUNTER — Ambulatory Visit (INDEPENDENT_AMBULATORY_CARE_PROVIDER_SITE_OTHER): Payer: Self-pay | Admitting: Orthopaedic Surgery

## 2020-08-03 ENCOUNTER — Encounter: Payer: Self-pay | Admitting: Orthopaedic Surgery

## 2020-08-03 VITALS — Ht 60.0 in | Wt 188.0 lb

## 2020-08-03 DIAGNOSIS — Z96641 Presence of right artificial hip joint: Secondary | ICD-10-CM

## 2020-08-03 DIAGNOSIS — M1612 Unilateral primary osteoarthritis, left hip: Secondary | ICD-10-CM

## 2020-08-03 NOTE — Progress Notes (Signed)
The patient is well-known to me.  She is about 6 weeks out from a right total hip arthroplasty to treat severe end-stage arthritis of the right hip.  She has done incredibly well with that hip replacement.  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  This has been well-known to [place] and documented and she is at the point where she wishes to have this scheduled for a left total hip arthroplasty.  I agree with this as well given her x-ray findings showing severe arthritis combined with her clinical exam findings.  Also, she has had a successful right total hip arthroplasty and continues to do well with that hip.  Her right operative hip moves smoothly and fluidly and her incisions healed nicely.  Her left hip has significant pain with attempts of internal and external rotation.  Previous x-rays of the left hip show severe end-stage arthritis with flattening of the femoral head and loss of joint space.  There is paratracheal osteophytes as well as sclerotic changes.  At this point we will work on getting her scheduled for a left total hip arthroplasty.  Having had this done just recently on her right hip she is fully aware of what the intraoperative and postoperative course involves as well as the risk and benefits of surgery.  We will be in touch hopefully in the near future about when this can be scheduled.

## 2020-08-05 ENCOUNTER — Ambulatory Visit: Payer: Self-pay | Admitting: Orthopaedic Surgery

## 2020-08-07 ENCOUNTER — Telehealth: Payer: Self-pay | Admitting: Orthopaedic Surgery

## 2020-08-07 NOTE — Telephone Encounter (Signed)
I spoke with the pt and she stated she only received half her check and was told by [facility] that her disability ends on 08/12/20. I called [employer] and for some reason the paperwork they have does say 08/12/20 but our paperwork says 09/22/20. I was unable to really talk to the rep from [facility] because her phone line was unclear and sounded like she was in a tunnel. We have refaxed the corrected paperwork to them.

## 2020-08-07 NOTE — Telephone Encounter (Signed)
Faxed form and note to [facility] [phone]

## 2020-08-07 NOTE — Telephone Encounter (Signed)
Note completed 

## 2020-08-07 NOTE — Telephone Encounter (Signed)
That will be fine.   Thank you

## 2020-08-07 NOTE — Telephone Encounter (Signed)
Is it ok to also write a work note keeping her out of work until 09/22/20 to also send with her disability paperwork?

## 2020-08-07 NOTE — Telephone Encounter (Signed)
The paperwork in our system has the end date for December... will call and discuss with pt.

## 2020-08-07 NOTE — Telephone Encounter (Signed)
Pt called stating she received a half check and she believes it's because her FMLA is only until 08/12/20. Pt would like for [place] to write a letter to [facility] extending her out of work since she had originally been approved to be out till 09/21/20 and Dr.Blackman planned to do surgery on her other hip. Pt would like a CB when this has been done  [phone]

## 2020-08-07 NOTE — Telephone Encounter (Signed)
refaxed [employer] form [phone]

## 2020-08-07 NOTE — Telephone Encounter (Signed)
Lvm for pt to call back to inform    Tammy can you please fax them the work note as well , thank you

## 2020-08-10 ENCOUNTER — Encounter: Payer: Self-pay | Admitting: Orthopaedic Surgery

## 2020-08-10 ENCOUNTER — Telehealth: Payer: Self-pay | Admitting: Orthopaedic Surgery

## 2020-08-10 NOTE — Telephone Encounter (Signed)
Pt called stating she has been trying to get scheduled for surgery and didn't get a CB; pt states she talked to someone else who is also a pt of Dr.Blackman and they told her they had already been scheduled. The pt would like a CB to explain what's going on.  [phone]

## 2020-08-10 NOTE — Telephone Encounter (Signed)
I called and advised patient due to back log will call her later to get her scheduled.  Calling patients in order of date of surgery order.

## 2020-08-13 ENCOUNTER — Telehealth: Payer: Self-pay | Admitting: Orthopaedic Surgery

## 2020-08-13 NOTE — Telephone Encounter (Signed)
Received vm from [facility] requesting ov note for 10/22 and another form.he stated he did received the 10/18 ov note and oow note but needs another form and 10/22 note. IC [phone], Sherril Croon, advised patient not seen 10/22, seen 10/18 and that ov note was faxed along with oow note and the form that was completed reflecting pt oow through 12/7. I advised there should not a need to complete another form. They have all the information needed to process claim. I advised to call me back with any questions

## 2020-08-17 ENCOUNTER — Encounter: Payer: Self-pay | Admitting: Orthopaedic Surgery

## 2020-08-18 ENCOUNTER — Other Ambulatory Visit: Payer: Self-pay | Admitting: Orthopaedic Surgery

## 2020-08-18 MED ORDER — HYDROCODONE-ACETAMINOPHEN 5-325 MG PO TABS: 1.0000 | ORAL_TABLET | Freq: Four times a day (QID) | ORAL | 0 refills | Status: DC | PRN

## 2020-08-25 ENCOUNTER — Encounter: Payer: Self-pay | Admitting: Orthopaedic Surgery

## 2020-08-28 ENCOUNTER — Other Ambulatory Visit: Payer: Self-pay

## 2020-08-31 ENCOUNTER — Telehealth: Payer: Self-pay | Admitting: Orthopaedic Surgery

## 2020-08-31 NOTE — Telephone Encounter (Signed)
Pt called wanting to make sure her forms from [employer] came through as they do have a deadline and pt would like to make sure she does everything on her end to get them processed. Pt would like a CB to confirm they've been received  [phone]

## 2020-08-31 NOTE — Telephone Encounter (Signed)
Pt called and stated she has already been called

## 2020-08-31 NOTE — Telephone Encounter (Signed)
They faxed old one to be updated for her 12/7 surgery. Form was updated though 2/12 and faxed back to [employer]

## 2020-08-31 NOTE — Telephone Encounter (Signed)
Do you have these forms?

## 2020-09-09 ENCOUNTER — Other Ambulatory Visit: Payer: Self-pay | Admitting: Physician Assistant

## 2020-09-17 NOTE — Progress Notes (Signed)
[id number] Rosalita Levan, [place] - [address] AT [facility] [address] [place] [address] Phone: [phone] Fax: [phone]  [facility] [address], [place] - [address] [place] [address] Phone: [phone] Fax: [phone]      Your procedure is scheduled on Tuesday 09/22/2020.  Report to [facility] Main Entrance "A" at 12:00 P.M., and check in at the Admitting office.  Call this number if you have problems the morning of surgery:  [phone]  Call [phone] if you have any questions prior to your surgery date Monday-Friday 8am-4pm    Remember:  Do not eat after midnight the night before your surgery  You may drink clear liquids until 11:00am the morning of your surgery.   Clear liquids allowed are: Water, Non-Citrus Juices (without pulp), Carbonated Beverages, Clear Tea, Black Coffee Only, and Gatorade  Enhanced Recovery after Surgery for Orthopedics Enhanced Recovery after Surgery is a protocol used to improve the stress on your body and your recovery after surgery.  Patient Instructions  . The night before surgery:  o No food after midnight. ONLY clear liquids after midnight  .  Marland Kitchen The day of surgery (if you do NOT have diabetes):  o Drink ONE (1) Pre-Surgery Clear Ensure by 11:00 am the morning of surgery   o This drink was given to you during your hospital  pre-op appointment visit. o Nothing else to drink after completing the  Pre-Surgery Clear Ensure.         If you have questions, please contact your surgeon's office.     Take these medicines the morning of surgery with A SIP OF WATER: Levothyroxine (Synthroid)  If NEEDED you may take the following medications the morning of surgery: Diphenhydramine (Benadryl) Hydrocodone-acetaminophen (Norco/Vicodin)   As of today, STOP taking any Aspirin (unless otherwise instructed by your surgeon) Aleve,  Naproxen, Ibuprofen, Motrin, Advil, Goody's, BC's, all herbal medications, fish oil, and all vitamins.                      Do not wear jewelry, make up, or nail polish            Do not wear lotions, powders, perfumes, or deodorant.            Do not shave 48 hours prior to surgery.            Do not bring valuables to the hospital.            [facility] is not responsible for any belongings or valuables.  Do NOT Smoke (Tobacco/Vaping) or drink Alcohol 24 hours prior to your procedure  If you use a CPAP at night, you may bring all equipment for your overnight stay.   Contacts, glasses, dentures or bridgework may not be worn into surgery.      For patients admitted to the hospital, discharge time will be determined by your treatment team.   Patients discharged the day of surgery will not be allowed to drive home, and someone needs to stay with them for 24 hours.    Special instructions:   [facility]- Preparing For Surgery  Before surgery, you can play an important role. Because skin is not sterile, your skin needs to be as free of germs as possible. You can reduce the number of germs on your skin by washing with CHG (chlorahexidine gluconate) Soap before surgery.  CHG  is an antiseptic cleaner which kills germs and bonds with the skin to continue killing germs even after washing.    Oral Hygiene is also important to reduce your risk of infection.  Remember - BRUSH YOUR TEETH THE MORNING OF SURGERY WITH YOUR REGULAR TOOTHPASTE  Please do not use if you have an allergy to CHG or antibacterial soaps. If your skin becomes reddened/irritated stop using the CHG.  Do not shave (including legs and underarms) for at least 48 hours prior to first CHG shower. It is OK to shave your face.  Please follow these instructions carefully.   1. Shower the NIGHT BEFORE SURGERY and the MORNING OF SURGERY with CHG Soap.   2. If you chose to wash your hair, wash your hair first as usual with your normal  shampoo.  3. After you shampoo, rinse your hair and body thoroughly to remove the shampoo.  4. Use CHG as you would any other liquid soap. You can apply CHG directly to the skin and wash gently with a scrungie or a clean washcloth.   5. Apply the CHG Soap to your body ONLY FROM THE NECK DOWN.  Do not use on open wounds or open sores. Avoid contact with your eyes, ears, mouth and genitals (private parts). Wash Face and genitals (private parts)  with your normal soap.   6. Wash thoroughly, paying special attention to the area where your surgery will be performed.  7. Thoroughly rinse your body with warm water from the neck down.  8. DO NOT shower/wash with your normal soap after using and rinsing off the CHG Soap.  9. Pat yourself dry with a CLEAN TOWEL.  10. Wear CLEAN PAJAMAS to bed the night before surgery  11. Place CLEAN SHEETS on your bed the night of your first shower and DO NOT SLEEP WITH PETS.   Day of Surgery: Shower with CHG soap as directed Wear Clean/Comfortable clothing the morning of surgery Do not apply any deodorants/lotions.   Remember to brush your teeth WITH YOUR REGULAR TOOTHPASTE.   Please read over the following fact sheets that you were given.

## 2020-09-18 ENCOUNTER — Encounter: Payer: Self-pay | Admitting: Orthopaedic Surgery

## 2020-09-18 ENCOUNTER — Encounter (HOSPITAL_COMMUNITY): Payer: Self-pay

## 2020-09-18 ENCOUNTER — Other Ambulatory Visit: Payer: Self-pay

## 2020-09-18 ENCOUNTER — Encounter (HOSPITAL_COMMUNITY)
Admission: RE | Admit: 2020-09-18 | Discharge: 2020-09-18 | Disposition: A | Discharge status: Home / Self Care | Payer: Self-pay | Source: Ambulatory Visit | Attending: Orthopaedic Surgery | Admitting: Orthopaedic Surgery

## 2020-09-18 ENCOUNTER — Other Ambulatory Visit (HOSPITAL_COMMUNITY)
Admission: RE | Admit: 2020-09-18 | Discharge: 2020-09-18 | Disposition: A | Discharge status: Home / Self Care | Payer: Self-pay | Source: Ambulatory Visit | Attending: Orthopaedic Surgery | Admitting: Orthopaedic Surgery

## 2020-09-18 DIAGNOSIS — Z01812 Encounter for preprocedural laboratory examination: Secondary | ICD-10-CM | POA: Insufficient documentation

## 2020-09-18 DIAGNOSIS — Z20822 Contact with and (suspected) exposure to covid-19: Secondary | ICD-10-CM | POA: Insufficient documentation

## 2020-09-18 LAB — CBC
HCT: 42.6 % (ref 36.0–46.0)
Hemoglobin: 13.6 g/dL (ref 12.0–15.0)
MCH: 30.7 pg (ref 26.0–34.0)
MCHC: 31.9 g/dL (ref 30.0–36.0)
MCV: 96.2 fL (ref 80.0–100.0)
Platelets: 467 10*3/uL — ABNORMAL HIGH (ref 150–400)
RBC: 4.43 MIL/uL (ref 3.87–5.11)
RDW: 14.9 % (ref 11.5–15.5)
WBC: 8.6 10*3/uL (ref 4.0–10.5)
nRBC: 0 % (ref 0.0–0.2)

## 2020-09-18 LAB — TYPE AND SCREEN
ABO/RH(D): A POS
Antibody Screen: NEGATIVE

## 2020-09-18 LAB — SURGICAL PCR SCREEN
MRSA, PCR: NEGATIVE
Staphylococcus aureus: NEGATIVE

## 2020-09-18 LAB — SARS CORONAVIRUS 2 (TAT 6-24 HRS): SARS Coronavirus 2: NEGATIVE

## 2020-09-18 NOTE — Progress Notes (Signed)
PCP - Dr. Derek Jack Cardiologist - Denies  Chest x-ray - Not indicated EKG - Not indicated Stress Test - Years ago done for diet plan no follow up needed ECHO - Denies Cardiac Cath - Denies  Sleep Study - Denies  DM - Denies  ERAS Protcol - Yes  PRE-SURGERY Ensure given  COVID TEST- 09/18/20  Anesthesia review: No  Patient denies shortness of breath, fever, cough and chest pain at PAT appointment   All instructions explained to the patient, with a verbal understanding of the material. Patient agrees to go over the instructions while at home for a better understanding. Patient also instructed to self quarantine after being tested for COVID-19. The opportunity to ask questions was provided.

## 2020-09-21 ENCOUNTER — Telehealth: Payer: Self-pay

## 2020-09-21 NOTE — Telephone Encounter (Signed)
Patient called she stated she is having surgery 12/7 she wants to know if Dr.Blackman has filled out [employer] forms. [phone] she stated she needs forms asap.

## 2020-09-21 NOTE — Telephone Encounter (Signed)
She needs to call [employer]

## 2020-09-21 NOTE — H&P (Signed)
TOTAL HIP ADMISSION H&P  Patient is admitted for left total hip arthroplasty.  Subjective:  Chief Complaint: left hip pain  HPI: Carla Mckee, 59 y.o. female, has a history of pain and functional disability in the left hip(s) due to arthritis and patient has failed non-surgical conservative treatments for greater than 12 weeks to include NSAID's and/or analgesics, corticosteriod injections, flexibility and strengthening excercises, supervised PT with diminished ADL's post treatment, use of assistive devices, weight reduction as appropriate and activity modification.  Onset of symptoms was gradual starting 3 years ago with gradually worsening course since that time.The patient noted no past surgery on the left hip(s).  Patient currently rates pain in the left hip at 10 out of 10 with activity. Patient has night pain, worsening of pain with activity and weight bearing, trendelenberg gait, pain that interfers with activities of daily living and pain with passive range of motion. Patient has evidence of subchondral sclerosis, periarticular osteophytes and joint space narrowing by imaging studies. This condition presents safety issues increasing the risk of falls.  There is no current active infection.  Patient Active Problem List   Diagnosis Date Noted  . Osteoarthritis 06/23/2020  . Status post total replacement of right hip 06/23/2020  . Primary osteoarthritis of right hip 04/22/2020  . Primary osteoarthritis of left hip 04/22/2020   Past Medical History:  Diagnosis Date  . Arthritis   . GERD (gastroesophageal reflux disease)   . Hypothyroidism     Past Surgical History:  Procedure Laterality Date  . CESAREAN SECTION     (2) C-sections  . CHOLECYSTECTOMY    . TOTAL HIP ARTHROPLASTY Right 06/23/2020   Procedure: RIGHT TOTAL HIP ARTHROPLASTY ANTERIOR APPROACH;  Surgeon: Kathryne Hitch, MD;  Location: [facility];  Service: Orthopedics;  Laterality: Right;  . TUBAL LIGATION  1998     No current facility-administered medications for this encounter.   Current Outpatient Medications  Medication Sig Dispense Refill Last Dose  . diphenhydrAMINE (BENADRYL) 25 mg capsule Take 25 mg by mouth daily as needed for allergies.      . furosemide (LASIX) 40 MG tablet Take 40 mg by mouth daily.      Marland Kitchen HYDROcodone-acetaminophen (NORCO/VICODIN) 5-325 MG tablet Take 1 tablet by mouth every 6 (six) hours as needed for moderate pain. 30 tablet 0   . levothyroxine (SYNTHROID) 175 MCG tablet Take 175 mcg by mouth daily before breakfast.     . oxyCODONE (OXY IR/ROXICODONE) 5 MG immediate release tablet Take 1-2 tablets (5-10 mg total) by mouth every 6 (six) hours as needed for severe pain. (Patient not taking: Reported on 08/03/2020) 30 tablet 0 Not Taking at Unknown time   No Known Allergies  Social History   Tobacco Use  . Smoking status: Never Smoker  . Smokeless tobacco: Never Used  Substance Use Topics  . Alcohol use: Not Currently    No family history on file.   Review of Systems  Musculoskeletal: Positive for gait problem.  All other systems reviewed and are negative.   Objective:  Physical Exam Vitals reviewed.  Constitutional:      Appearance: Normal appearance.  HENT:     Head: Normocephalic and atraumatic.  Eyes:     Extraocular Movements: Extraocular movements intact.     Pupils: Pupils are equal, round, and reactive to light.  Cardiovascular:     Rate and Rhythm: Normal rate.     Pulses: Normal pulses.  Pulmonary:     Effort: Pulmonary effort is normal.  Abdominal:     Palpations: Abdomen is soft.  Musculoskeletal:     Cervical back: Normal range of motion.     Left hip: Tenderness and bony tenderness present. Decreased range of motion. Decreased strength.  Neurological:     Mental Status: She is alert and oriented to person, place, and time.  Psychiatric:        Behavior: Behavior normal.     Vital signs in last 24 hours:    Labs:   Estimated  body mass index is 37.5 kg/m as calculated from the following:   Height as of 09/18/20: 5' (1.524 m).   Weight as of 09/18/20: 87.1 kg.   Imaging Review Plain radiographs demonstrate severe degenerative joint disease of the left hip(s). The bone quality appears to be good for age and reported activity level.      Assessment/Plan:  End stage arthritis, left hip(s)  The patient history, physical examination, clinical judgement of the provider and imaging studies are consistent with end stage degenerative joint disease of the left hip(s) and total hip arthroplasty is deemed medically necessary. The treatment options including medical management, injection therapy, arthroscopy and arthroplasty were discussed at length. The risks and benefits of total hip arthroplasty were presented and reviewed. The risks due to aseptic loosening, infection, stiffness, dislocation/subluxation,  thromboembolic complications and other imponderables were discussed.  The patient acknowledged the explanation, agreed to proceed with the plan and consent was signed. Patient is being admitted for inpatient treatment for surgery, pain control, PT, OT, prophylactic antibiotics, VTE prophylaxis, progressive ambulation and ADL's and discharge planning.The patient is planning to be discharged home with home health services

## 2020-09-22 ENCOUNTER — Observation Stay (HOSPITAL_COMMUNITY): Payer: Self-pay

## 2020-09-22 ENCOUNTER — Other Ambulatory Visit: Payer: Self-pay

## 2020-09-22 ENCOUNTER — Encounter (HOSPITAL_COMMUNITY)
Admission: RE | Disposition: A | Discharge status: Home / Self Care | Payer: Self-pay | Source: Home / Self Care | Attending: Orthopaedic Surgery

## 2020-09-22 ENCOUNTER — Ambulatory Visit (HOSPITAL_COMMUNITY): Payer: Self-pay | Admitting: Anesthesiology

## 2020-09-22 ENCOUNTER — Observation Stay (HOSPITAL_COMMUNITY)
Admission: RE | Admit: 2020-09-22 | Discharge: 2020-09-23 | Disposition: A | Discharge status: Home / Self Care | Payer: Self-pay | Attending: Orthopaedic Surgery | Admitting: Orthopaedic Surgery

## 2020-09-22 ENCOUNTER — Ambulatory Visit (HOSPITAL_COMMUNITY): Payer: Self-pay

## 2020-09-22 ENCOUNTER — Encounter (HOSPITAL_COMMUNITY): Payer: Self-pay | Admitting: Orthopaedic Surgery

## 2020-09-22 DIAGNOSIS — Z96642 Presence of left artificial hip joint: Secondary | ICD-10-CM

## 2020-09-22 DIAGNOSIS — Z419 Encounter for procedure for purposes other than remedying health state, unspecified: Secondary | ICD-10-CM

## 2020-09-22 DIAGNOSIS — M1612 Unilateral primary osteoarthritis, left hip: Principal | ICD-10-CM | POA: Insufficient documentation

## 2020-09-22 DIAGNOSIS — Z96641 Presence of right artificial hip joint: Secondary | ICD-10-CM | POA: Insufficient documentation

## 2020-09-22 DIAGNOSIS — Z79899 Other long term (current) drug therapy: Secondary | ICD-10-CM | POA: Insufficient documentation

## 2020-09-22 DIAGNOSIS — E039 Hypothyroidism, unspecified: Secondary | ICD-10-CM | POA: Insufficient documentation

## 2020-09-22 HISTORY — PX: TOTAL HIP ARTHROPLASTY: SHX124

## 2020-09-22 SURGERY — ARTHROPLASTY, HIP, TOTAL, ANTERIOR APPROACH
Anesthesia: Spinal | Site: Hip | Laterality: Left

## 2020-09-22 MED ORDER — DEXAMETHASONE SODIUM PHOSPHATE 10 MG/ML IJ SOLN
INTRAMUSCULAR | Status: DC | PRN
  Administered 2020-09-22: 5 mg via INTRAVENOUS

## 2020-09-22 MED ORDER — POVIDONE-IODINE 10 % EX SWAB: 2.0000 "application " | Freq: Once | CUTANEOUS | Status: DC

## 2020-09-22 MED ORDER — PROPOFOL 10 MG/ML IV BOLUS
INTRAVENOUS | Status: AC
  Filled 2020-09-22: qty 20

## 2020-09-22 MED ORDER — POLYETHYLENE GLYCOL 3350 17 G PO PACK: 17.0000 g | PACK | Freq: Every day | ORAL | Status: DC | PRN

## 2020-09-22 MED ORDER — MENTHOL 3 MG MT LOZG: 1.0000 | LOZENGE | OROMUCOSAL | Status: DC | PRN

## 2020-09-22 MED ORDER — ASPIRIN 81 MG PO CHEW
81.0000 mg | CHEWABLE_TABLET | Freq: Two times a day (BID) | ORAL | Status: DC
  Administered 2020-09-22 – 2020-09-23 (×2): 81 mg via ORAL
  Filled 2020-09-22 (×2): qty 1

## 2020-09-22 MED ORDER — CHLORHEXIDINE GLUCONATE 0.12 % MT SOLN
15.0000 mL | Freq: Once | OROMUCOSAL | Status: AC
  Administered 2020-09-22: 15 mL via OROMUCOSAL
  Filled 2020-09-22: qty 15

## 2020-09-22 MED ORDER — SODIUM CHLORIDE 0.9 % IR SOLN
Status: DC | PRN
  Administered 2020-09-22: 1000 mL

## 2020-09-22 MED ORDER — PROMETHAZINE HCL 25 MG/ML IJ SOLN: 6.2500 mg | INTRAMUSCULAR | Status: DC | PRN

## 2020-09-22 MED ORDER — ALUM & MAG HYDROXIDE-SIMETH 200-200-20 MG/5ML PO SUSP: 30.0000 mL | ORAL | Status: DC | PRN

## 2020-09-22 MED ORDER — TRANEXAMIC ACID-NACL 1000-0.7 MG/100ML-% IV SOLN
1000.0000 mg | INTRAVENOUS | Status: AC
  Administered 2020-09-22: 1000 mg via INTRAVENOUS

## 2020-09-22 MED ORDER — DEXMEDETOMIDINE (PRECEDEX) IN NS 20 MCG/5ML (4 MCG/ML) IV SYRINGE
PREFILLED_SYRINGE | INTRAVENOUS | Status: DC | PRN
  Administered 2020-09-22 (×2): 8 ug via INTRAVENOUS
  Administered 2020-09-22: 4 ug via INTRAVENOUS

## 2020-09-22 MED ORDER — LEVOTHYROXINE SODIUM 75 MCG PO TABS
175.0000 ug | ORAL_TABLET | Freq: Every day | ORAL | Status: DC
  Administered 2020-09-23: 175 ug via ORAL
  Filled 2020-09-22: qty 1

## 2020-09-22 MED ORDER — OXYCODONE HCL 5 MG PO TABS
10.0000 mg | ORAL_TABLET | ORAL | Status: DC | PRN
  Administered 2020-09-22 – 2020-09-23 (×4): 10 mg via ORAL
  Filled 2020-09-22 (×3): qty 2

## 2020-09-22 MED ORDER — OXYCODONE HCL 5 MG PO TABS
5.0000 mg | ORAL_TABLET | ORAL | Status: DC | PRN
  Administered 2020-09-22: 5 mg via ORAL
  Filled 2020-09-22: qty 2

## 2020-09-22 MED ORDER — ONDANSETRON HCL 4 MG/2ML IJ SOLN
INTRAMUSCULAR | Status: DC | PRN
  Administered 2020-09-22: 4 mg via INTRAVENOUS

## 2020-09-22 MED ORDER — PROPOFOL 500 MG/50ML IV EMUL
INTRAVENOUS | Status: DC | PRN
  Administered 2020-09-22: 75 ug/kg/min via INTRAVENOUS

## 2020-09-22 MED ORDER — FUROSEMIDE 40 MG PO TABS
40.0000 mg | ORAL_TABLET | Freq: Every day | ORAL | Status: DC
  Administered 2020-09-23: 40 mg via ORAL
  Filled 2020-09-22: qty 1

## 2020-09-22 MED ORDER — CEFAZOLIN SODIUM-DEXTROSE 2-4 GM/100ML-% IV SOLN
2.0000 g | INTRAVENOUS | Status: DC
  Filled 2020-09-22: qty 100

## 2020-09-22 MED ORDER — ONDANSETRON HCL 4 MG PO TABS: 4.0000 mg | ORAL_TABLET | Freq: Four times a day (QID) | ORAL | Status: DC | PRN

## 2020-09-22 MED ORDER — FENTANYL CITRATE (PF) 100 MCG/2ML IJ SOLN
INTRAMUSCULAR | Status: AC
  Administered 2020-09-22: 25 ug
  Filled 2020-09-22: qty 2

## 2020-09-22 MED ORDER — PROPOFOL 10 MG/ML IV BOLUS
INTRAVENOUS | Status: DC | PRN
  Administered 2020-09-22 (×2): 20 mg via INTRAVENOUS
  Administered 2020-09-22: 70 mg via INTRAVENOUS

## 2020-09-22 MED ORDER — OXYCODONE HCL 5 MG/5ML PO SOLN: 5.0000 mg | Freq: Once | ORAL | Status: DC | PRN

## 2020-09-22 MED ORDER — METHOCARBAMOL 500 MG PO TABS
500.0000 mg | ORAL_TABLET | Freq: Four times a day (QID) | ORAL | Status: DC | PRN
  Administered 2020-09-22 – 2020-09-23 (×2): 500 mg via ORAL
  Filled 2020-09-22 (×2): qty 1

## 2020-09-22 MED ORDER — PANTOPRAZOLE SODIUM 40 MG PO TBEC
40.0000 mg | DELAYED_RELEASE_TABLET | Freq: Every day | ORAL | Status: DC
  Administered 2020-09-22 – 2020-09-23 (×2): 40 mg via ORAL
  Filled 2020-09-22 (×2): qty 1

## 2020-09-22 MED ORDER — METHOCARBAMOL 500 MG PO TABS
ORAL_TABLET | ORAL | Status: AC
  Filled 2020-09-22: qty 1

## 2020-09-22 MED ORDER — LACTATED RINGERS IV SOLN: INTRAVENOUS | Status: DC

## 2020-09-22 MED ORDER — ACETAMINOPHEN 325 MG PO TABS: 325.0000 mg | ORAL_TABLET | Freq: Four times a day (QID) | ORAL | Status: DC | PRN

## 2020-09-22 MED ORDER — METOCLOPRAMIDE HCL 5 MG PO TABS: 5.0000 mg | ORAL_TABLET | Freq: Three times a day (TID) | ORAL | Status: DC | PRN

## 2020-09-22 MED ORDER — PHENYLEPHRINE HCL (PRESSORS) 10 MG/ML IV SOLN
INTRAVENOUS | Status: DC | PRN
  Administered 2020-09-22: 80 ug via INTRAVENOUS

## 2020-09-22 MED ORDER — MIDAZOLAM HCL 2 MG/2ML IJ SOLN
INTRAMUSCULAR | Status: AC
  Filled 2020-09-22: qty 2

## 2020-09-22 MED ORDER — DIPHENHYDRAMINE HCL 12.5 MG/5ML PO ELIX: 12.5000 mg | ORAL_SOLUTION | ORAL | Status: DC | PRN

## 2020-09-22 MED ORDER — CEFAZOLIN SODIUM-DEXTROSE 2-3 GM-%(50ML) IV SOLR
INTRAVENOUS | Status: DC | PRN
  Administered 2020-09-22: 2 g via INTRAVENOUS

## 2020-09-22 MED ORDER — CEFAZOLIN SODIUM-DEXTROSE 1-4 GM/50ML-% IV SOLN
1.0000 g | Freq: Four times a day (QID) | INTRAVENOUS | Status: AC
  Administered 2020-09-22 – 2020-09-23 (×2): 1 g via INTRAVENOUS
  Filled 2020-09-22 (×2): qty 50

## 2020-09-22 MED ORDER — OXYCODONE HCL 5 MG PO TABS: 5.0000 mg | ORAL_TABLET | Freq: Once | ORAL | Status: DC | PRN

## 2020-09-22 MED ORDER — OXYCODONE HCL 5 MG PO TABS
ORAL_TABLET | ORAL | Status: AC
  Filled 2020-09-22: qty 1

## 2020-09-22 MED ORDER — PHENYLEPHRINE HCL-NACL 10-0.9 MG/250ML-% IV SOLN
INTRAVENOUS | Status: DC | PRN
  Administered 2020-09-22: 25 ug/min via INTRAVENOUS

## 2020-09-22 MED ORDER — ONDANSETRON HCL 4 MG/2ML IJ SOLN: 4.0000 mg | Freq: Four times a day (QID) | INTRAMUSCULAR | Status: DC | PRN

## 2020-09-22 MED ORDER — METHOCARBAMOL 1000 MG/10ML IJ SOLN
500.0000 mg | Freq: Four times a day (QID) | INTRAVENOUS | Status: DC | PRN
  Filled 2020-09-22: qty 5

## 2020-09-22 MED ORDER — DOCUSATE SODIUM 100 MG PO CAPS
100.0000 mg | ORAL_CAPSULE | Freq: Two times a day (BID) | ORAL | Status: DC
  Administered 2020-09-22 – 2020-09-23 (×2): 100 mg via ORAL
  Filled 2020-09-22 (×2): qty 1

## 2020-09-22 MED ORDER — FENTANYL CITRATE (PF) 100 MCG/2ML IJ SOLN: 25.0000 ug | INTRAMUSCULAR | Status: DC | PRN

## 2020-09-22 MED ORDER — HYDROMORPHONE HCL 1 MG/ML IJ SOLN
0.5000 mg | INTRAMUSCULAR | Status: DC | PRN
  Administered 2020-09-22 – 2020-09-23 (×2): 1 mg via INTRAVENOUS
  Filled 2020-09-22 (×2): qty 1

## 2020-09-22 MED ORDER — GABAPENTIN 100 MG PO CAPS
100.0000 mg | ORAL_CAPSULE | Freq: Three times a day (TID) | ORAL | Status: DC
  Administered 2020-09-22 – 2020-09-23 (×2): 100 mg via ORAL
  Filled 2020-09-22 (×2): qty 1

## 2020-09-22 MED ORDER — TRANEXAMIC ACID-NACL 1000-0.7 MG/100ML-% IV SOLN
INTRAVENOUS | Status: AC
  Filled 2020-09-22: qty 100

## 2020-09-22 MED ORDER — ORAL CARE MOUTH RINSE: 15.0000 mL | Freq: Once | OROMUCOSAL | Status: AC

## 2020-09-22 MED ORDER — METOCLOPRAMIDE HCL 5 MG/ML IJ SOLN: 5.0000 mg | Freq: Three times a day (TID) | INTRAMUSCULAR | Status: DC | PRN

## 2020-09-22 MED ORDER — PHENOL 1.4 % MT LIQD: 1.0000 | OROMUCOSAL | Status: DC | PRN

## 2020-09-22 MED ORDER — SODIUM CHLORIDE 0.9 % IV SOLN: INTRAVENOUS | Status: DC

## 2020-09-22 SURGICAL SUPPLY — 54 items
BENZOIN TINCTURE PRP APPL 2/3 (GAUZE/BANDAGES/DRESSINGS) ×2 IMPLANT
BLADE CLIPPER SURG (BLADE) IMPLANT
BLADE SAW SGTL 18X1.27X75 (BLADE) ×2 IMPLANT
COVER SURGICAL LIGHT HANDLE (MISCELLANEOUS) ×2 IMPLANT
COVER WAND RF STERILE (DRAPES) ×2 IMPLANT
CUP ACET PINNACLE SECTR 48MM (Joint) ×1 IMPLANT
DRAPE C-ARM 42X72 X-RAY (DRAPES) ×2 IMPLANT
DRAPE STERI IOBAN 125X83 (DRAPES) ×2 IMPLANT
DRAPE U-SHAPE 47X51 STRL (DRAPES) ×6 IMPLANT
DRSG AQUACEL AG ADV 3.5X10 (GAUZE/BANDAGES/DRESSINGS) ×2 IMPLANT
DURAPREP 26ML APPLICATOR (WOUND CARE) ×2 IMPLANT
ELECT BLADE 4.0 EZ CLEAN MEGAD (MISCELLANEOUS) ×2
ELECT BLADE 6.5 EXT (BLADE) IMPLANT
ELECT REM PT RETURN 9FT ADLT (ELECTROSURGICAL) ×2
ELECTRODE BLDE 4.0 EZ CLN MEGD (MISCELLANEOUS) ×1 IMPLANT
ELECTRODE REM PT RTRN 9FT ADLT (ELECTROSURGICAL) ×1 IMPLANT
FACESHIELD WRAPAROUND (MASK) ×4 IMPLANT
GAUZE XEROFORM 5X9 LF (GAUZE/BANDAGES/DRESSINGS) ×2 IMPLANT
GLOVE BIOGEL PI IND STRL 8 (GLOVE) ×2 IMPLANT
GLOVE BIOGEL PI INDICATOR 8 (GLOVE) ×2
GLOVE ECLIPSE 8.0 STRL XLNG CF (GLOVE) ×2 IMPLANT
GLOVE ORTHO TXT STRL SZ7.5 (GLOVE) ×4 IMPLANT
GOWN STRL REUS W/ TWL LRG LVL3 (GOWN DISPOSABLE) ×2 IMPLANT
GOWN STRL REUS W/ TWL XL LVL3 (GOWN DISPOSABLE) ×2 IMPLANT
GOWN STRL REUS W/TWL LRG LVL3 (GOWN DISPOSABLE) ×2
GOWN STRL REUS W/TWL XL LVL3 (GOWN DISPOSABLE) ×2
HANDPIECE INTERPULSE COAX TIP (DISPOSABLE) ×1
HEAD FEM STD 32X+1 STRL (Hips) ×2 IMPLANT
KIT BASIN OR (CUSTOM PROCEDURE TRAY) ×2 IMPLANT
KIT TURNOVER KIT B (KITS) ×2 IMPLANT
LINER ACET 32X48 (Liner) ×2 IMPLANT
MANIFOLD NEPTUNE II (INSTRUMENTS) ×2 IMPLANT
NS IRRIG 1000ML POUR BTL (IV SOLUTION) ×2 IMPLANT
PACK TOTAL JOINT (CUSTOM PROCEDURE TRAY) ×2 IMPLANT
PAD ARMBOARD 7.5X6 YLW CONV (MISCELLANEOUS) ×2 IMPLANT
PINNSECTOR W/GRIP ACE CUP 48MM (Joint) ×2 IMPLANT
SET HNDPC FAN SPRY TIP SCT (DISPOSABLE) ×1 IMPLANT
STAPLER VISISTAT 35W (STAPLE) ×2 IMPLANT
STEM CORAIL KLA10 (Stem) ×2 IMPLANT
STRIP CLOSURE SKIN 1/2X4 (GAUZE/BANDAGES/DRESSINGS) ×4 IMPLANT
SUT ETHIBOND NAB CT1 #1 30IN (SUTURE) ×2 IMPLANT
SUT MNCRL AB 4-0 PS2 18 (SUTURE) IMPLANT
SUT VIC AB 0 CT1 27 (SUTURE) ×1
SUT VIC AB 0 CT1 27XBRD ANBCTR (SUTURE) ×1 IMPLANT
SUT VIC AB 1 CT1 27 (SUTURE) ×1
SUT VIC AB 1 CT1 27XBRD ANBCTR (SUTURE) ×1 IMPLANT
SUT VIC AB 2-0 CT1 27 (SUTURE) ×1
SUT VIC AB 2-0 CT1 TAPERPNT 27 (SUTURE) ×1 IMPLANT
TOWEL GREEN STERILE (TOWEL DISPOSABLE) ×2 IMPLANT
TOWEL GREEN STERILE FF (TOWEL DISPOSABLE) ×2 IMPLANT
TRAY CATH 16FR W/PLASTIC CATH (SET/KITS/TRAYS/PACK) IMPLANT
TRAY FOLEY W/BAG SLVR 16FR (SET/KITS/TRAYS/PACK) ×1
TRAY FOLEY W/BAG SLVR 16FR ST (SET/KITS/TRAYS/PACK) ×1 IMPLANT
WATER STERILE IRR 1000ML POUR (IV SOLUTION) ×4 IMPLANT

## 2020-09-22 NOTE — Interval H&P Note (Signed)
History and Physical Interval Note: The patient is here today for a left total hip arthroplasty to treat the pain from her left hip osteoarthritis.  There is been no acute change in her medical status.  See recent H&P.  The risk and benefits of been discussed in detail of the surgery and informed consent is obtained.  The left hip is been marked.  09/22/2020 1:59 PM  Carla Mckee  has presented today for surgery, with the diagnosis of osteoarthritis left hip.  The various methods of treatment have been discussed with the patient and family. After consideration of risks, benefits and other options for treatment, the patient has consented to  Procedure(s): LEFT TOTAL HIP ARTHROPLASTY ANTERIOR APPROACH (Left) as a surgical intervention.  The patient's history has been reviewed, patient examined, no change in status, stable for surgery.  I have reviewed the patient's chart and labs.  Questions were answered to the patient's satisfaction.     Kathryne Hitch

## 2020-09-22 NOTE — Anesthesia Procedure Notes (Signed)
Procedure Name: LMA Insertion Date/Time: 09/22/2020 3:45 PM Performed by: Tillman Abide, CRNA Pre-anesthesia Checklist: Patient identified, Emergency Drugs available, Suction available and Patient being monitored Patient Re-evaluated:Patient Re-evaluated prior to induction Oxygen Delivery Method: Circle System Utilized Preoxygenation: Pre-oxygenation with 100% oxygen Induction Type: IV induction Ventilation: Mask ventilation without difficulty LMA: LMA inserted LMA Size: 4.0 Number of attempts: 1 Placement Confirmation: positive ETCO2 Tube secured with: Tape Dental Injury: Teeth and Oropharynx as per pre-operative assessment

## 2020-09-22 NOTE — Anesthesia Preprocedure Evaluation (Addendum)
Anesthesia Evaluation  Patient identified by MRN, date of birth, ID band Patient awake    Reviewed: Allergy & Precautions, NPO status , Patient's Chart, lab work & pertinent test results  History of Anesthesia Complications Negative for: history of anesthetic complications  Airway Mallampati: II  TM Distance: >3 FB Neck ROM: Full    Dental  (+) Dental Advisory Given, Chipped   Pulmonary neg pulmonary ROS,    Pulmonary exam normal        Cardiovascular negative cardio ROS Normal cardiovascular exam     Neuro/Psych negative neurological ROS  negative psych ROS   GI/Hepatic Neg liver ROS, GERD  ,  Endo/Other  Hypothyroidism  Obesity   Renal/GU negative Renal ROS     Musculoskeletal  (+) Arthritis ,   Abdominal   Peds  Hematology negative hematology ROS (+)   Anesthesia Other Findings Covid test negative   Reproductive/Obstetrics                            Anesthesia Physical Anesthesia Plan  ASA: II  Anesthesia Plan: Spinal   Post-op Pain Management:    Induction:   PONV Risk Score and Plan: 2 and Treatment may vary due to age or medical condition and Propofol infusion  Airway Management Planned: Natural Airway and Simple Face Mask  Additional Equipment: None  Intra-op Plan:   Post-operative Plan:   Informed Consent: I have reviewed the patients History and Physical, chart, labs and discussed the procedure including the risks, benefits and alternatives for the proposed anesthesia with the patient or authorized representative who has indicated his/her understanding and acceptance.       Plan Discussed with: CRNA and Anesthesiologist  Anesthesia Plan Comments: (Labs reviewed, platelets acceptable. Discussed risks and benefits of spinal, including spinal/epidural hematoma, infection, failed block, and PDPH. Patient expressed understanding and wished to proceed. )        Anesthesia Quick Evaluation

## 2020-09-22 NOTE — Discharge Instructions (Signed)

## 2020-09-22 NOTE — Brief Op Note (Signed)
09/22/2020  4:12 PM  PATIENT:  Carla Mckee  59 y.o. female  PRE-OPERATIVE DIAGNOSIS:  osteoarthritis left hip  POST-OPERATIVE DIAGNOSIS:  osteoarthritis left hip  PROCEDURE:  Procedure(s): LEFT TOTAL HIP ARTHROPLASTY ANTERIOR APPROACH (Left)  SURGEON:  Surgeon(s) and Role:    Kathryne Hitch, MD - Primary  PHYSICIAN ASSISTANT:  Rexene Edison, PA-C  ANESTHESIA:   spinal and general  EBL:  200 mL   COUNTS:  YES  DICTATION: .Other Dictation: Dictation Number [id number]  PLAN OF CARE: Admit for overnight observation  PATIENT DISPOSITION:  PACU - hemodynamically stable.   Delay start of Pharmacological VTE agent (>24hrs) due to surgical blood loss or risk of bleeding: no

## 2020-09-22 NOTE — Transfer of Care (Signed)
Immediate Anesthesia Transfer of Care Note  Patient: Carla Mckee  Procedure(s) Performed: LEFT TOTAL HIP ARTHROPLASTY ANTERIOR APPROACH (Left Hip)  Patient Location: PACU  Anesthesia Type:General  Level of Consciousness: awake, alert , oriented and patient cooperative  Airway & Oxygen Therapy: Patient Spontanous Breathing  Post-op Assessment: Report given to RN and Post -op Vital signs reviewed and stable  Post vital signs: Reviewed and stable  Last Vitals:  Vitals Value Taken Time  BP 107/66 09/22/20 1631  Temp 36.2 C 09/22/20 1630  Pulse 74 09/22/20 1637  Resp 19 09/22/20 1637  SpO2 98 % 09/22/20 1637  Vitals shown include unvalidated device data.  Last Pain:  Vitals:   09/22/20 1230  TempSrc:   PainSc: 4       Patients Stated Pain Goal: 2 (09/22/20 1230)  Complications: No complications documented.

## 2020-09-23 ENCOUNTER — Encounter (HOSPITAL_COMMUNITY): Payer: Self-pay | Admitting: Orthopaedic Surgery

## 2020-09-23 LAB — CBC
HCT: 35.4 % — ABNORMAL LOW (ref 36.0–46.0)
Hemoglobin: 11.3 g/dL — ABNORMAL LOW (ref 12.0–15.0)
MCH: 30.4 pg (ref 26.0–34.0)
MCHC: 31.9 g/dL (ref 30.0–36.0)
MCV: 95.2 fL (ref 80.0–100.0)
Platelets: 421 10*3/uL — ABNORMAL HIGH (ref 150–400)
RBC: 3.72 MIL/uL — ABNORMAL LOW (ref 3.87–5.11)
RDW: 14.6 % (ref 11.5–15.5)
WBC: 12.4 10*3/uL — ABNORMAL HIGH (ref 4.0–10.5)
nRBC: 0 % (ref 0.0–0.2)

## 2020-09-23 LAB — BASIC METABOLIC PANEL
Anion gap: 8 (ref 5–15)
BUN: 10 mg/dL (ref 6–20)
CO2: 24 mmol/L (ref 22–32)
Calcium: 8.7 mg/dL — ABNORMAL LOW (ref 8.9–10.3)
Chloride: 106 mmol/L (ref 98–111)
Creatinine, Ser: 0.72 mg/dL (ref 0.44–1.00)
GFR, Estimated: >60 mL/min (ref >60)
Glucose, Bld: 137 mg/dL — ABNORMAL HIGH (ref 70–99)
Potassium: 4.6 mmol/L (ref 3.5–5.1)
Sodium: 138 mmol/L (ref 135–145)

## 2020-09-23 MED ORDER — ASPIRIN 81 MG PO CHEW
81.0000 mg | CHEWABLE_TABLET | Freq: Two times a day (BID) | ORAL | Status: DC
Start: 2020-09-23 — End: 2020-11-12

## 2020-09-23 NOTE — Progress Notes (Signed)
Physical Therapy Note  (Full note to follow)  Eval complete, and OK for dc home from PT standpoint ;  Contacted Dr. Magnus Ivan, [facility] RN, and G. Chestine Spore, PA with discharge recommendations;   Van Clines, [place]  [facility] Pager [phone] Office [phone]

## 2020-09-23 NOTE — TOC Transition Note (Addendum)
Transition of Care [facility]) - CM/SW Discharge Note   Patient Details  Name: Carla Mckee MRN: [id number] Date of Birth: 1961/08/13  Transition of Care [facility]) CM/SW Contact:  Epifanio Lesches, RN Phone Number: 09/23/2020, 10:24 AM   Clinical Narrative:    Patient will DC [facility]  Anticipated DC date: 09/24/2011 Family notified: yes Transport by: car  -s/p L THA Per MD patient ready for DC today . RN, patient and pt's daughter notified of DC. Pt declined HH services. States has had similar surgery on R hip and did well. 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  Pt without Rx med concerns or affordability. Post hospital f/u noted on AVS. RNCM will sign off for now as intervention is no longer needed. Please consult [place] again if new needs arise.    Final next level of care: Home/Self Care (declined [facility] services , MD made aware) Barriers to Discharge: No Barriers Identified   Patient Goals and CMS Choice Patient states their goals for this hospitalization and ongoing recovery are:: to go home and progress      Discharge Placement                       Discharge Plan and Services                                     Social Determinants of Health (SDOH) Interventions     Readmission Risk Interventions Readmission Risk Prevention Plan 06/24/2020  Post Dischage Appt Complete  Medication Screening Complete  Transportation Screening Complete

## 2020-09-23 NOTE — Progress Notes (Signed)
Subjective: 1 Day Post-Op Procedure(s) (LRB): LEFT TOTAL HIP ARTHROPLASTY ANTERIOR APPROACH (Left) Patient reports pain as mild.  No complaints wants to go home today. Has meds at home from prior surgery including ASA.   Objective: Vital signs in last 24 hours: Temp:  [97 F (36.1 C)-98.9 F (37.2 C)] 97.9 F (36.6 C) (12/08 0758) Pulse Rate:  [57-78] 72 (12/08 0758) Resp:  [8-25] 20 (12/08 0758) BP: (93-170)/(41-98) 114/65 (12/08 0758) SpO2:  [96 %-100 %] 98 % (12/08 0758) Weight:  [87.1 kg] 87.1 kg (12/07 1208)  Intake/Output from previous day: 12/07 0701 - 12/08 0700 In: 1840 [P.O.:240; I.V.:1500; IV Piggyback:100] Out: 1825 [Urine:1625; Blood:200] Intake/Output this shift: No intake/output data recorded.  Recent Labs    09/23/20 0252  HGB 11.3*   Recent Labs    09/23/20 0252  WBC 12.4*  RBC 3.72*  HCT 35.4*  PLT 421*   Recent Labs    09/23/20 0252  NA 138  K 4.6  CL 106  CO2 24  BUN 10  CREATININE 0.72  GLUCOSE 137*  CALCIUM 8.7*   No results for input(s): LABPT, INR in the last 72 hours.  Sensation intact distally Intact pulses distally Incision: dressing C/D/I Compartment soft   Assessment/Plan: 1 Day Post-Op Procedure(s) (LRB): LEFT TOTAL HIP ARTHROPLASTY ANTERIOR APPROACH (Left) Up with therapy Discharge home with home health later today if patient remains stable and does well with PT.       Kamarian Sahakian 09/23/2020, 8:23 AM

## 2020-09-23 NOTE — Progress Notes (Signed)
D/C pt as ordered. Pt remains alert/oriented in no apparent distress. Volunteers wheeled pt downstairs  For transport.

## 2020-09-23 NOTE — Op Note (Signed)
Carla Mckee, Carla Mckee MEDICAL RECORD CV:[id number] ACCOUNT [id number] DATE OF BIRTH:December 08, 1960 FACILITY: [facility] LOCATION: [facility] PHYSICIAN:Solace Manwarren Aretha Parrot, MD  OPERATIVE REPORT  DATE OF PROCEDURE:  09/22/2020  PREOPERATIVE DIAGNOSES:  Primary osteoarthritis and degenerative joint disease, left hip.  POSTOPERATIVE DIAGNOSES:  Primary osteoarthritis and degenerative joint disease, left hip.  PROCEDURE:  Left total hip arthroplasty through direct anterior approach.  IMPLANTS:  DePuy Sector Gription acetabular component size 48, size 32+0 neutral polyethylene liner, size 10 Corail femoral component with varus offset, size 32+1 metal hip ball.  SURGEON:  Vanita Panda. Magnus Ivan, MD  ASSISTANT:  Richardean Canal, PA-C  ANESTHESIA: 1.  Spinal. 2.  General via LMA.  ANTIBIOTICS:  Two grams IV Ancef.  BLOOD LOSS:  200 mL.  COMPLICATIONS:  None.  INDICATIONS:  The patient is a 59 year old female well known to me.  She has a BMI of 37 and debilitating arthritis involving both her hips.  We actually successfully replaced her right hip in just September of this year through direct anterior approach.   She has done so well with that hip that she wanted to proceed with a left total hip arthroplasty and I agree with this, given the severity of arthritis of her left hip and the detrimental effect her left hip arthritis is having on her mobility, her  quality of life and activities of daily living.  Having had this surgery before, she is fully aware of the risk of acute blood loss anemia, nerve and vessel injury, fracture, infection, dislocation, DVT, implant failure and skin and soft tissue issues.   She understands our goals are to decrease pain, improve mobility and overall improve quality of life.  DESCRIPTION OF PROCEDURE:  After informed consent was obtained, appropriate left hip was marked.  She was brought to the operating room and sat up on the stretcher where spinal anesthesia was  obtained.  She was then laid in the supine position.  A Foley  catheter was placed and traction boots were placed on both her feet.  She actually started off a little bit shorter on her left side than the right.  She was then placed supine on the Hana fracture table, with the perineal post in place and both legs in  line skeletal traction device and no traction applied.  Her left operative hip was prepped and draped with DuraPrep and sterile drapes.  A timeout was called and she was identified as correct patient, correct left hip.  I then made an incision just  inferior and posterior to the anterior superior iliac spine and carried this obliquely down the leg.  We dissected down the tensor fascia lata muscle.  Tensor fascia was then divided longitudinally to proceed with direct anterior approach to the hip.  We  identified and cauterized circumflex vessels and identified the hip capsule.  We opened up the hip capsule in an L-type format, finding a moderate joint effusion and significant periarticular osteophytes around the lateral femoral head and neck.  I  placed Cobra retractors within the joint capsule around the medial and lateral femoral neck and made our femoral neck cut just proximal to the lesser trochanter with an oscillating saw and completed this with an osteotome.  I placed a corkscrew guide in  the femoral head and removed the femoral head in its entirety.  It was a very small femoral head and devoid of cartilage.  I then placed a bent Hohmann over the medial acetabular rim and removed remnants of the acetabular labrum and  other debris.  We  then began reaming in stepwise increments from a size 42 reamer, going up to a size 47 with all reamers under direct visualization, the last reamer under direct fluoroscopy, so we could obtain our depth of reaming, our inclination and anteversion.  The  patient did cough quite a bit during the case as it was at this point that the anesthesia placed LMA.  I  then placed the real DePuy Sector Gription acetabular component size 48 and we did not need to place the screw on this side.  I was pleased with my  placement under direct fluoroscopy.  We then placed a 32+0 neutral polyethylene liner for that size 48 acetabular component.  Attention was then turned to the femur.  With the leg externally rotated to 120 degrees, extended and adducted, we were able to  place a Mueller retractor medially and Hohmann retractor behind the greater trochanter.  We released lateral joint capsule and used a box-cutting osteotome to enter the femoral canal and a rongeur to lateralize.  I then began broaching using the Corail  broaching system going from a size 8 to a size 10.  This correlated with her other side.  We tried a varus offset femoral neck based on our operative experience from our last case.  We placed a 32+1 trial hip ball.  We brought the leg back over and up  and with traction and internal rotation, reduced this in the pelvis and we were pleased with leg length, offset, range of motion and stability, assessed radiographically and mechanically.  We then dislocated the hip and removed the trial components.  We  placed the real Corail femoral component size 10 with varus offset and the real 32+1 metal hip ball and again reduced this in the acetabulum.  We appreciated stability, leg length and offset.  We also appreciated the range of motion.  We then irrigated  the soft tissue with normal saline solution using pulsatile lavage.  We were able to close the joint capsule with interrupted #1 Ethibond suture.  #1 Vicryl was used to close the tensor fascia, 0 Vicryl was used to close the subcutaneous tissue and 2-0  Vicryl was used to close the subcuticular tissue.  The skin was closed with staples.  An Aquacel dressing was applied.  She was taken to recovery room in stable condition.  All final counts were correct.  There were no complications noted.  Of note, Rexene Edison, PA-C,  assisted during the entire case and assistance was crucial for facilitating all aspects of this case.  HN/NUANCE  D:09/22/2020 T:09/23/2020 JOB:[id number]

## 2020-09-23 NOTE — Plan of Care (Signed)
Patient is s/p left total hip arthroplasty. SCDs, continuous pulse ox, and IVF in place per post op orders. Pain controlled with prn meds and IV antibx given as ordered. Foley catheter will be removed in the morning by 6am. Will continue to monitor and continue current POC.

## 2020-09-23 NOTE — Evaluation (Signed)
Physical Therapy Evaluation Patient Details Name: Carla Mckee MRN: [id number] DOB: 01/12/1961 Today's Date: 09/23/2020   History of Present Illness  Patient is 59 y.o. female s/p L THA anterior approach on 12/7 with PMH significant for GERD, hypothyroidism ,OA., prev R THA  Clinical Impression  Patient evaluated by Physical Therapy with no further acute PT needs identified, as the plan is to dc home today;  All education has been completed and the patient has no further questions. Performed bed mobility, transfers, progressive amb, and stair training well; Provided pt with HEP and she looked over and had no questions; Well versed in recovery post THA from her previous THA in Sept;  See below for any follow-up Physical Therapy or equipment needs. PT is signing off. Thank you for this referral.     Follow Up Recommendations Outpatient PT;Other (comment) (The potential need for Outpatient PT can be addressed at Ortho follow-up appointments. )    Equipment Recommendations  Cane    Recommendations for Other Services       Precautions / Restrictions Precautions Precautions: None Restrictions LLE Weight Bearing: Weight bearing as tolerated      Mobility  Bed Mobility Overal bed mobility: Modified Independent                  Transfers Overall transfer level: Modified independent Equipment used: Rolling walker (2 wheeled)             General transfer comment: No difficulty  Ambulation/Gait Ambulation/Gait assistance: Supervision;Modified independent (Device/Increase time) Gait Distance (Feet): 150 Feet Assistive device: Rolling walker (2 wheeled) Gait Pattern/deviations: Step-through pattern (emerging)     General Gait Details: cues for upright posture and for full hip extension in L stance  Stairs Stairs: Yes Stairs assistance: Supervision Stair Management: Two rails;Step to pattern Number of Stairs: 3 General stair comments: Able to follow sequence well;  no difficulty  Wheelchair Mobility    Modified Rankin (Stroke Patients Only)       Balance Overall balance assessment: Mild deficits observed, not formally tested                                           Pertinent Vitals/Pain Pain Assessment: 0-10 Pain Score: 8  Pain Location: L hip Pain Descriptors / Indicators: Aching Pain Intervention(s): Monitored during session    Home Living Family/patient expects to be discharged to:: Private residence Living Arrangements: Alone Available Help at Discharge: Family Type of Home: House Home Access: Stairs to enter Entrance Stairs-Rails: Right Entrance Stairs-Number of Steps: 3+1 Home Layout: One level Home Equipment: Walker - 2 wheels;Grab bars - tub/shower;Cane - single point Additional Comments: pt's neice will stay with her for a week    Prior Function Level of Independence: Independent;Independent with assistive device(s)         Comments: using RW for about 1 month prior to surgery     Hand Dominance   Dominant Hand: Right    Extremity/Trunk Assessment   Upper Extremity Assessment Upper Extremity Assessment: Overall WFL for tasks assessed    Lower Extremity Assessment Lower Extremity Assessment: LLE deficits/detail LLE Deficits / Details: Grossly decr AROM and strength, limited by pain postop       Communication   Communication: No difficulties  Cognition Arousal/Alertness: Awake/alert Behavior During Therapy: WFL for tasks assessed/performed Overall Cognitive Status: Within Functional Limits for tasks assessed  General Comments General comments (skin integrity, edema, etc.): Discussed car transfers and overall managing; Used a cane to help as a leg lifter for bed mobility    Exercises     Assessment/Plan    PT Assessment All further PT needs can be met in the next venue of care  PT Problem List Decreased strength;Decreased  range of motion;Decreased activity tolerance;Decreased balance;Decreased mobility;Decreased coordination;Pain       PT Treatment Interventions      PT Goals (Current goals can be found in the Care Plan section)  Acute Rehab PT Goals Patient Stated Goal: hopes to go home today PT Goal Formulation: All assessment and education complete, DC therapy    Frequency     Barriers to discharge        Co-evaluation               AM-PAC PT "6 Clicks" Mobility  Outcome Measure Help needed turning from your back to your side while in a flat bed without using bedrails?: None Help needed moving from lying on your back to sitting on the side of a flat bed without using bedrails?: None Help needed moving to and from a bed to a chair (including a wheelchair)?: None Help needed standing up from a chair using your arms (e.g., wheelchair or bedside chair)?: A Little Help needed to walk in hospital room?: A Little Help needed climbing 3-5 steps with a railing? : A Little 6 Click Score: 21    End of Session Equipment Utilized During Treatment: Gait belt Activity Tolerance: Patient tolerated treatment well Patient left: in chair;with call bell/phone within reach Nurse Communication: Mobility status (and ok for dc) PT Visit Diagnosis: Other abnormalities of gait and mobility (R26.89)    Time: 5498-2641 PT Time Calculation (min) (ACUTE ONLY): 45 min   Charges:   PT Evaluation $PT Eval Low Complexity: 1 Low PT Treatments $Gait Training: 8-22 mins $Therapeutic Activity: 8-22 mins        Roney Marion, PT  Acute Rehabilitation Services Pager [phone] Office [phone]'   Colletta [place] 09/23/2020, 2:34 PM

## 2020-09-23 NOTE — Discharge Summary (Signed)
Patient ID: Carla Mckee MRN: [id number] DOB/AGE: [birth year] 59 y.o.  Admit date: 09/22/2020 Discharge date: 09/23/2020  Admission Diagnoses:  Principal Problem:   Primary osteoarthritis of left hip Active Problems:   Status post total replacement of left hip   Discharge Diagnoses:  Same  Past Medical History:  Diagnosis Date  . Arthritis   . GERD (gastroesophageal reflux disease)   . Hypothyroidism     Surgeries: Procedure(s): LEFT TOTAL HIP ARTHROPLASTY ANTERIOR APPROACH on 09/22/2020   Consultants:   Discharged Condition: Improved  Hospital Course: Carla Mckee is an 59 y.o. female who was admitted 09/22/2020 for operative treatment ofPrimary osteoarthritis of left hip. Patient has severe unremitting pain that affects sleep, daily activities, and work/hobbies. After pre-op clearance the patient was taken to the operating room on 09/22/2020 and underwent  Procedure(s): LEFT TOTAL HIP ARTHROPLASTY ANTERIOR APPROACH.    Patient was given perioperative antibiotics:  Anti-infectives (From admission, onward)   Start     Dose/Rate Route Frequency Ordered Stop   09/22/20 2100  ceFAZolin (ANCEF) IVPB 1 g/50 mL premix        1 g 100 mL/hr over 30 Minutes Intravenous Every 6 hours 09/22/20 2027 09/23/20 0635   09/22/20 1200  ceFAZolin (ANCEF) IVPB 2g/100 mL premix  Status:  Discontinued        2 g 200 mL/hr over 30 Minutes Intravenous On call to O.R. 09/22/20 1158 09/22/20 1825       Patient was given sequential compression devices, early ambulation, and chemoprophylaxis to prevent DVT.  Patient benefited maximally from hospital stay and there were no complications.    Recent vital signs:  Patient Vitals for the past 24 hrs:  BP Temp Temp src Pulse Resp SpO2 Height Weight  09/23/20 0758 114/65 97.9 F (36.6 C) Oral 72 20 98 % -- --  09/23/20 0429 (!) 93/47 98.7 F (37.1 C) -- 70 17 99 % -- --  09/22/20 2230 (!) 165/65 98.3 F (36.8 C) Oral 76 17 100 % -- --   09/22/20 2130 (!) 170/67 98.3 F (36.8 C) Oral 78 17 98 % -- --  09/22/20 2020 (!) 166/96 98.9 F (37.2 C) -- 70 17 100 % -- --  09/22/20 1930 135/65 (!) 97 F (36.1 C) -- 64 13 99 % -- --  09/22/20 1900 (!) 147/98 -- -- 65 14 100 % -- --  09/22/20 1830 125/76 -- -- 63 18 100 % -- --  09/22/20 1800 117/74 (!) 97 F (36.1 C) -- 61 18 100 % -- --  09/22/20 1730 (!) 117/55 -- -- 66 (!) 25 100 % -- --  09/22/20 1715 -- -- -- (!) 57 13 100 % -- --  09/22/20 1700 109/65 -- -- 60 13 99 % -- --  09/22/20 1645 (!) 101/41 -- -- 68 18 96 % -- --  09/22/20 1630 107/66 (!) 97.2 F (36.2 C) -- 76 (!) 8 98 % -- --  09/22/20 1208 (!) 140/59 97.8 F (36.6 C) Temporal 76 18 97 % 5' (1.524 m) 87.1 kg     Recent laboratory studies:  Recent Labs    09/23/20 0252  WBC 12.4*  HGB 11.3*  HCT 35.4*  PLT 421*  NA 138  K 4.6  CL 106  CO2 24  BUN 10  CREATININE 0.72  GLUCOSE 137*  CALCIUM 8.7*     Discharge Medications:   Allergies as of 09/23/2020   No Known Allergies     Medication List  TAKE these medications   aspirin 81 MG chewable tablet Chew 1 tablet (81 mg total) by mouth 2 (two) times daily.   diphenhydrAMINE 25 mg capsule Commonly known as: BENADRYL Take 25 mg by mouth daily as needed for allergies.   furosemide 40 MG tablet Commonly known as: LASIX Take 40 mg by mouth daily.   HYDROcodone-acetaminophen 5-325 MG tablet Commonly known as: NORCO/VICODIN Take 1 tablet by mouth every 6 (six) hours as needed for moderate pain.   levothyroxine 175 MCG tablet Commonly known as: SYNTHROID Take 175 mcg by mouth daily before breakfast.            Durable Medical Equipment  (From admission, onward)         Start     Ordered   09/22/20 2028  DME 3 n 1  Once        09/22/20 2027   09/22/20 2028  DME Walker rolling  Once       Question Answer Comment  Walker: With 5 Inch Wheels   Patient needs a walker to treat with the following condition Status post total  replacement of left hip      09/22/20 2027          Diagnostic Studies: DG Pelvis Portable  Result Date: 09/22/2020 CLINICAL DATA:  59 year old female status post left hip replacement. Pre-existing right hip replacement. EXAM: PORTABLE PELVIS 1-2 VIEWS COMPARISON:  Intraoperative images 15 11 hours today. FINDINGS: Portable AP supine view at 1630 hours. Left bipolar hip arthroplasty again noted with normal AP alignment, intact hardware. Pre-existing right total appears hip stable. Arthroplasty hardware no unexpected osseous changes. Left hip region skin staples and postoperative soft tissue gas. IMPRESSION: Left bipolar hip arthroplasty with no adverse features. Electronically Signed   By: Odessa Fleming M.D.   On: 09/22/2020 17:05   DG C-Arm 1-60 Min  Result Date: 09/22/2020 CLINICAL DATA:  59 year old female left hip replacement. Pre-existing right hip replacement. EXAM: OPERATIVE LEFT HIP (WITH PELVIS IF PERFORMED) 3 VIEWS TECHNIQUE: Fluoroscopic spot image(s) were submitted for interpretation post-operatively. COMPARISON:  PELVIS RADIOGRAPH 06/23/2020. FINDINGS: 3 intraoperative fluoroscopic spot views of the left hip and lower pelvis. New left hip bipolar arthroplasty hardware appears intact and normally aligned. Partially visible pre-existing right total hip arthroplasty. No unexpected osseous changes. FLUOROSCOPY TIME:  0 minutes 22 seconds IMPRESSION: New left hip bipolar arthroplasty with no adverse features. Electronically Signed   By: Odessa Fleming M.D.   On: 09/22/2020 17:13   DG HIP OPERATIVE UNILAT W OR W/O PELVIS LEFT  Result Date: 09/22/2020 CLINICAL DATA:  59 year old female left hip replacement. Pre-existing right hip replacement. EXAM: OPERATIVE LEFT HIP (WITH PELVIS IF PERFORMED) 3 VIEWS TECHNIQUE: Fluoroscopic spot image(s) were submitted for interpretation post-operatively. COMPARISON:  PELVIS RADIOGRAPH 06/23/2020. FINDINGS: 3 intraoperative fluoroscopic spot views of the left hip and  lower pelvis. New left hip bipolar arthroplasty hardware appears intact and normally aligned. Partially visible pre-existing right total hip arthroplasty. No unexpected osseous changes. FLUOROSCOPY TIME:  0 minutes 22 seconds IMPRESSION: New left hip bipolar arthroplasty with no adverse features. Electronically Signed   By: Odessa Fleming M.D.   On: 09/22/2020 17:13    Disposition: Discharge disposition: 01-Home or Self Care          Follow-up Information    Kathryne Hitch, MD Follow up in 2 week(s).   Specialty: Orthopedic Surgery Contact information: [address] [place] [address] [phone]  SignedRichardean Canal 09/23/2020, 8:50 AM

## 2020-09-23 NOTE — Anesthesia Postprocedure Evaluation (Signed)
Anesthesia Post Note  Patient: Carla Mckee  Procedure(s) Performed: LEFT TOTAL HIP ARTHROPLASTY ANTERIOR APPROACH (Left Hip)     Patient location during evaluation: PACU Anesthesia Type: Spinal Level of consciousness: awake and alert Pain management: pain level controlled Vital Signs Assessment: post-procedure vital signs reviewed and stable Respiratory status: spontaneous breathing and respiratory function stable Cardiovascular status: blood pressure returned to baseline and stable Postop Assessment: spinal receding and no apparent nausea or vomiting Anesthetic complications: no   No complications documented.  Last Vitals:  Vitals:   09/23/20 0429 09/23/20 0758  BP: (!) 93/47 114/65  Pulse: 70 72  Resp: 17 20  Temp: 37.1 C 36.6 C  SpO2: 99% 98%    Last Pain:  Vitals:   09/23/20 0758  TempSrc: Oral  PainSc:                  Beryle Lathe

## 2020-09-23 NOTE — Progress Notes (Signed)
dsicharge summary packet provided to pt with instructions. Pt verbalized understanding of instructions. No complaints voiced. Per pt her daughter would be responsible for her ride back home.

## 2020-09-30 ENCOUNTER — Encounter: Payer: Self-pay | Admitting: Orthopaedic Surgery

## 2020-10-01 ENCOUNTER — Other Ambulatory Visit: Payer: Self-pay | Admitting: Orthopaedic Surgery

## 2020-10-01 MED ORDER — HYDROCODONE-ACETAMINOPHEN 5-325 MG PO TABS: 1.0000 | ORAL_TABLET | Freq: Four times a day (QID) | ORAL | 0 refills | Status: DC | PRN

## 2020-10-07 ENCOUNTER — Other Ambulatory Visit: Payer: Self-pay

## 2020-10-07 ENCOUNTER — Ambulatory Visit (INDEPENDENT_AMBULATORY_CARE_PROVIDER_SITE_OTHER): Payer: Self-pay | Admitting: Orthopaedic Surgery

## 2020-10-07 ENCOUNTER — Encounter: Payer: Self-pay | Admitting: Orthopaedic Surgery

## 2020-10-07 DIAGNOSIS — Z96642 Presence of left artificial hip joint: Secondary | ICD-10-CM

## 2020-10-07 NOTE — Progress Notes (Signed)
Office Visit Note   Patient: Carla Mckee           Date of Birth: [birth year]           MRN: [id number] Visit Date: 10/07/2020              Requested by: Cheral Bay, MD [address] [place],  [place] [address] PCP: Cheral Bay, MD   Assessment & Plan: Visit Diagnoses:  1. Status post total replacement of left hip     Plan: She is doing very well postoperative.  She will continue to transition from a walker to a cane and then to no assistive device when she is comfortable with her balance and coordination.  I would like to see her back in 4 weeks to see how she is doing overall but no x-rays are needed.  She can stop her twice daily aspirin.  Follow-Up Instructions: Return in about 4 weeks (around 11/04/2020).   Orders:  No orders of the defined types were placed in this encounter.  No orders of the defined types were placed in this encounter.     Procedures: No procedures performed   Clinical Data: No additional findings.   Subjective: Chief Complaint  Patient presents with  . Left Hip - Routine Post Op  The patient is 2 weeks status post a left total hip arthroplasty.  She is 3 months out from her right hip being replaced.  She is very active individual.  She is making great progress and said that she is not taking pain medicine and that her hips are both doing well.  She denies any fever or chills or nausea or vomiting.  HPI  Review of Systems She denies any acute change in her medical status.  Objective: Vital Signs: There were no vitals taken for this visit.  Physical Exam She is alert and orient x3 and in no acute distress Ortho Exam Examination of her left hip incision shows it looks good.  There is no seroma at all.  I was able to remove the staples in place Steri-Strips.  Her leg lengths are equal.  Her feet are not swollen and her calf is soft. Specialty Comments:  No specialty comments available.  Imaging: No results  found.   PMFS History: Patient Active Problem List   Diagnosis Date Noted  . Status post total replacement of left hip 09/22/2020  . Osteoarthritis 06/23/2020  . Status post total replacement of right hip 06/23/2020  . Primary osteoarthritis of right hip 04/22/2020  . Primary osteoarthritis of left hip 04/22/2020   Past Medical History:  Diagnosis Date  . Arthritis   . GERD (gastroesophageal reflux disease)   . Hypothyroidism     History reviewed. No pertinent family history.  Past Surgical History:  Procedure Laterality Date  . CESAREAN SECTION     (2) C-sections  . CHOLECYSTECTOMY    . TOTAL HIP ARTHROPLASTY Right 06/23/2020   Procedure: RIGHT TOTAL HIP ARTHROPLASTY ANTERIOR APPROACH;  Surgeon: Kathryne Hitch, MD;  Location: [facility];  Service: Orthopedics;  Laterality: Right;  . TOTAL HIP ARTHROPLASTY Left 09/22/2020   Procedure: LEFT TOTAL HIP ARTHROPLASTY ANTERIOR APPROACH;  Surgeon: Kathryne Hitch, MD;  Location: [facility] OR;  Service: Orthopedics;  Laterality: Left;  . TUBAL LIGATION  1998   Social History   Occupational History  . Not on file  Tobacco Use  . Smoking status: Never Smoker  . Smokeless tobacco: Never Used  Vaping  Use  . Vaping Use: Never used  Substance and Sexual Activity  . Alcohol use: Not Currently  . Drug use: Never  . Sexual activity: Not Currently    Birth control/protection: Post-menopausal

## 2020-10-08 ENCOUNTER — Encounter: Payer: Self-pay | Admitting: Orthopaedic Surgery

## 2020-10-09 ENCOUNTER — Encounter: Payer: Self-pay | Admitting: Orthopaedic Surgery

## 2020-11-04 ENCOUNTER — Ambulatory Visit: Payer: Self-pay | Admitting: Orthopaedic Surgery

## 2020-11-12 ENCOUNTER — Encounter: Payer: Self-pay | Admitting: Physician Assistant

## 2020-11-12 ENCOUNTER — Ambulatory Visit (INDEPENDENT_AMBULATORY_CARE_PROVIDER_SITE_OTHER): Payer: Self-pay | Admitting: Physician Assistant

## 2020-11-12 DIAGNOSIS — Z96642 Presence of left artificial hip joint: Secondary | ICD-10-CM

## 2020-11-12 NOTE — Progress Notes (Signed)
HPI: Carla Mckee returns today follow-up of her left total hip arthroplasty which she underwent on 09/22/2020. She states she is doing well she notes that she is not taking anything for pain. States that her incision is well-healed. She is no longer on any aspirin for DVT prophylaxis. She is wondering what activities she can get back to doing.  Physical exam: Left hip good range of motion without pain. Calf supple nontender dorsiflexion plantarflexion left ankle intact. Walks with slight antalgic gait.  Impression: Status post left total hip arthroplasty 09/22/2020  Plan: We will see her back in 4 weeks to reevaluate currently in her return to work status. She will continue to work on range of motion strengthening of the left hip. Questions were encouraged and answered.

## 2020-11-16 ENCOUNTER — Encounter: Payer: Self-pay | Admitting: Orthopaedic Surgery

## 2020-11-17 ENCOUNTER — Telehealth: Payer: Self-pay | Admitting: Orthopaedic Surgery

## 2020-11-17 NOTE — Telephone Encounter (Signed)
[employer] forms received. Sent to [employer]. 

## 2020-11-23 ENCOUNTER — Encounter: Payer: Self-pay | Admitting: Orthopaedic Surgery

## 2020-12-10 ENCOUNTER — Ambulatory Visit: Payer: Self-pay | Admitting: Physician Assistant

## 2020-12-14 ENCOUNTER — Ambulatory Visit (INDEPENDENT_AMBULATORY_CARE_PROVIDER_SITE_OTHER): Payer: Self-pay | Admitting: Physician Assistant

## 2020-12-14 ENCOUNTER — Encounter: Payer: Self-pay | Admitting: Physician Assistant

## 2020-12-14 DIAGNOSIS — Z96642 Presence of left artificial hip joint: Secondary | ICD-10-CM

## 2020-12-14 DIAGNOSIS — Z96641 Presence of right artificial hip joint: Secondary | ICD-10-CM

## 2020-12-14 NOTE — Progress Notes (Signed)
HPI: Carla Mckee returns today status post left total hip arthroplasty 09/22/2020.  She is overall doing much better.  She did have some pain last week but felt that was due to her overdoing it.  Is mainly here to discuss her return to work.  She would like to return to work on December 21, 2020.  Physical exam: Left hip good range of motion without pain.  Ambulates without any assistive device.  Impression: Status post left total hip arthroplasty 09/22/2020   Plan: She is given a note to return to work full duties December 21, 2020.  We will see her back in 6 months obtain an AP pelvis and lateral view left hip.  We will see her sooner if there is any questions concerns.  Questions were encouraged and answered.

## 2021-01-25 ENCOUNTER — Ambulatory Visit: Payer: Self-pay | Admitting: Physician Assistant

## 2021-04-16 ENCOUNTER — Telehealth: Payer: Self-pay | Admitting: Orthopaedic Surgery

## 2021-04-16 NOTE — Telephone Encounter (Signed)
Faxed records 09/23/20-present to [facility]. [phone] per their request.

## 2022-01-11 IMAGING — DX DG PORTABLE PELVIS
1 series · 1 of 1 positions shown · non-contrast
Comparison: X-ray hip 06/23/2020 [DATE] a.m.

CLINICAL DATA: Postop right hip replacement

EXAM:
PORTABLE PELVIS 1-2 VIEWS

[pelvis ap]
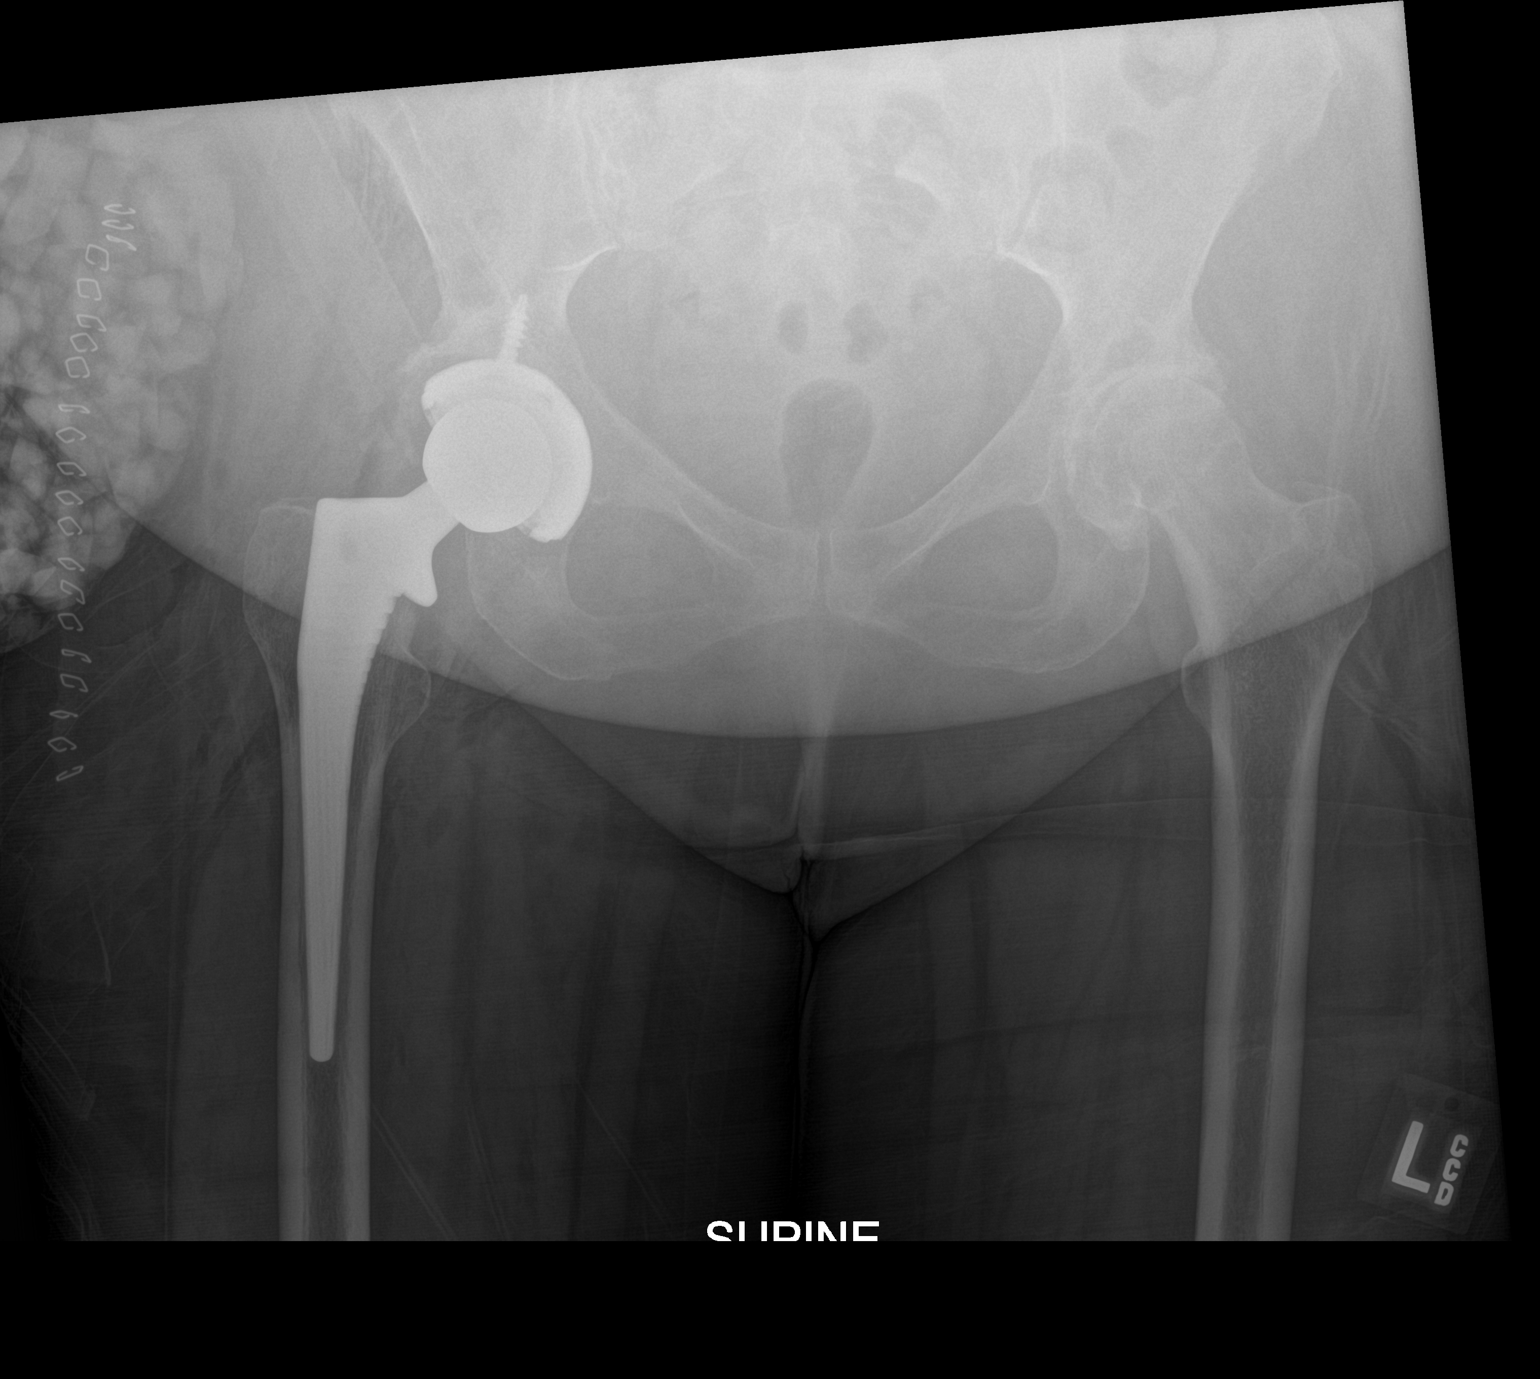

[1 of 1 positions shown; findings below may reference images not displayed]

FINDINGS: Status post total right hip arthroplasty with femoral and acetabular
components in alignments. Skin staples overlie the right hip. Bones
of the pelvis and left hip are unremarkable. Expected postoperative
changes within the surrounding subcutaneous soft tissues.
IMPRESSION: Status post total right hip arthroplasty.

## 2022-01-11 IMAGING — RF DG HIP (WITH PELVIS) OPERATIVE*R*
1 series · 3 of 3 positions shown · non-contrast
Comparison: April 14, 2020.

CLINICAL DATA: Status post right hip replacement.

EXAM:
OPERATIVE right HIP (WITH PELVIS IF PERFORMED) 3 VIEWS
TECHNIQUE: Fluoroscopic spot image(s) were submitted for interpretation
post-operatively.
Radiation exposure index: 3.2680 mGy.

[Series 1: unknown protocol · 0.20mm/px · 3 of 3 slices shown]
[im 1/3]
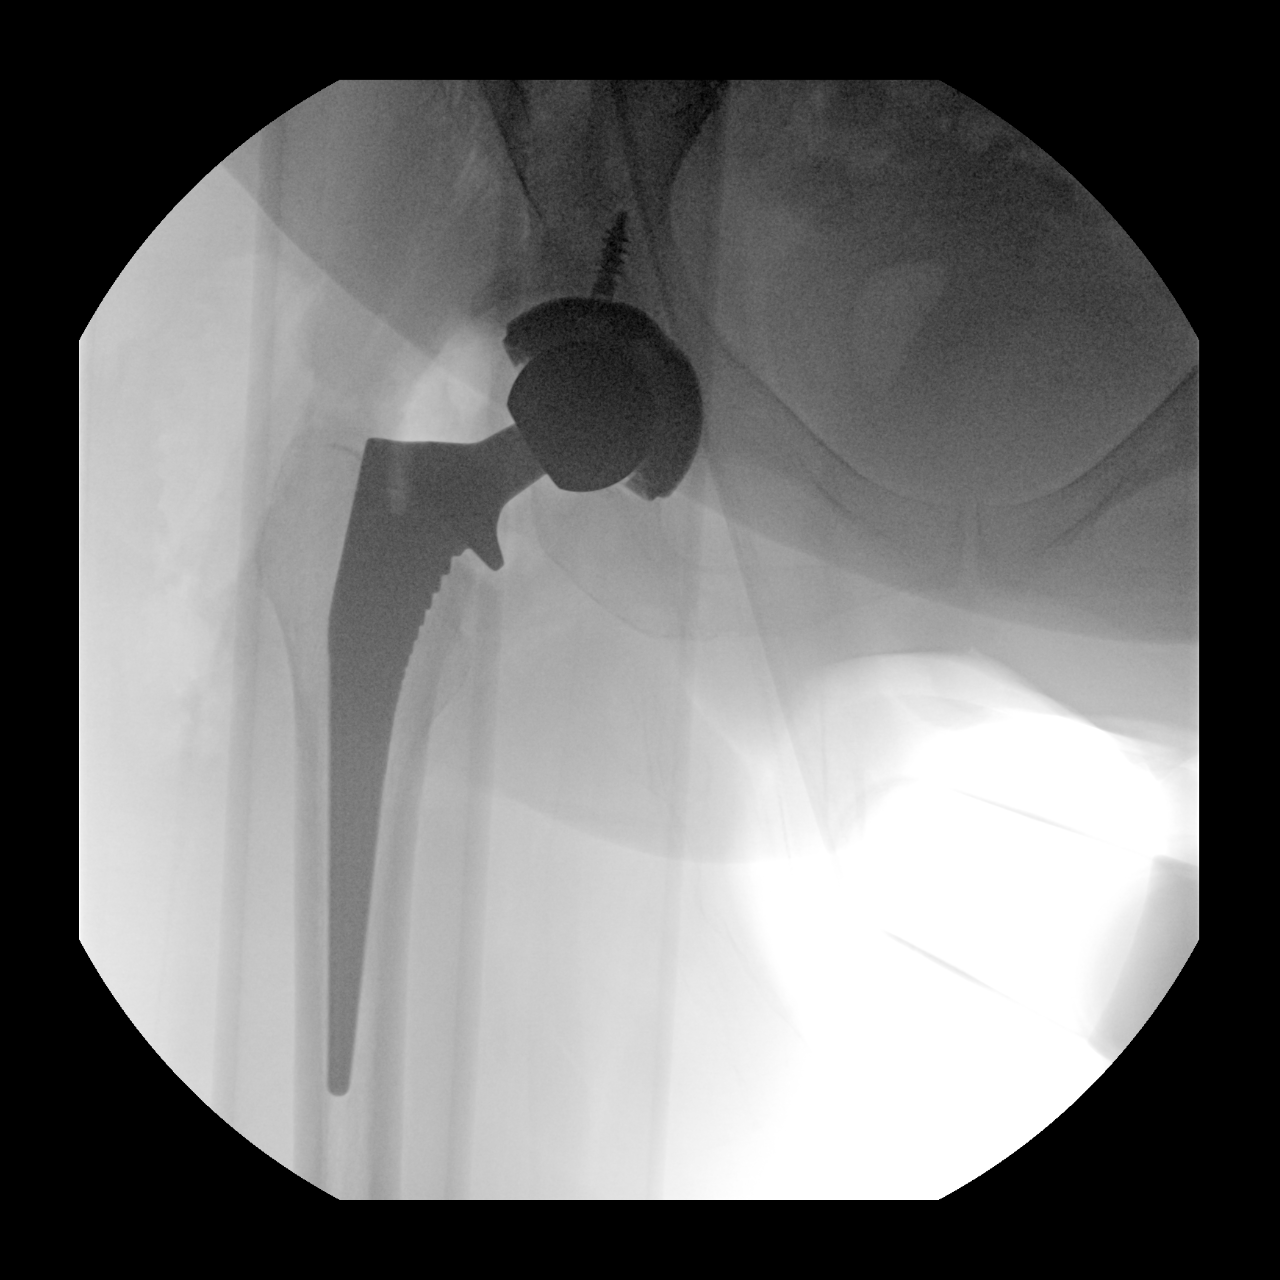
[im 2/3]
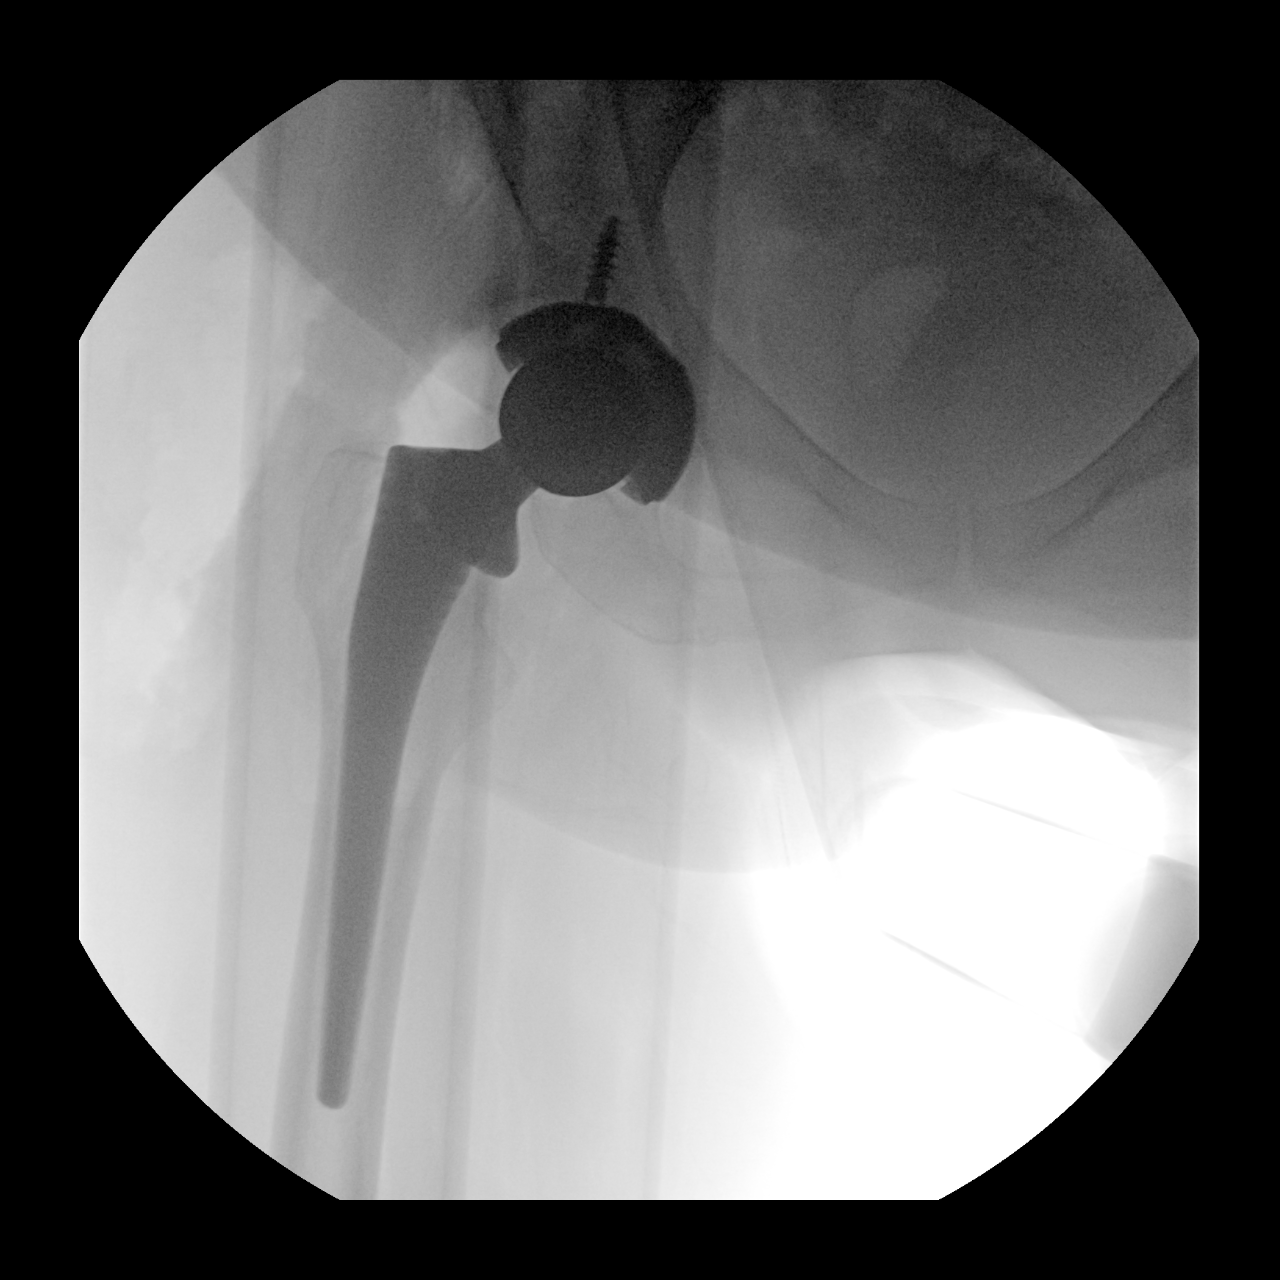
[im 3/3]
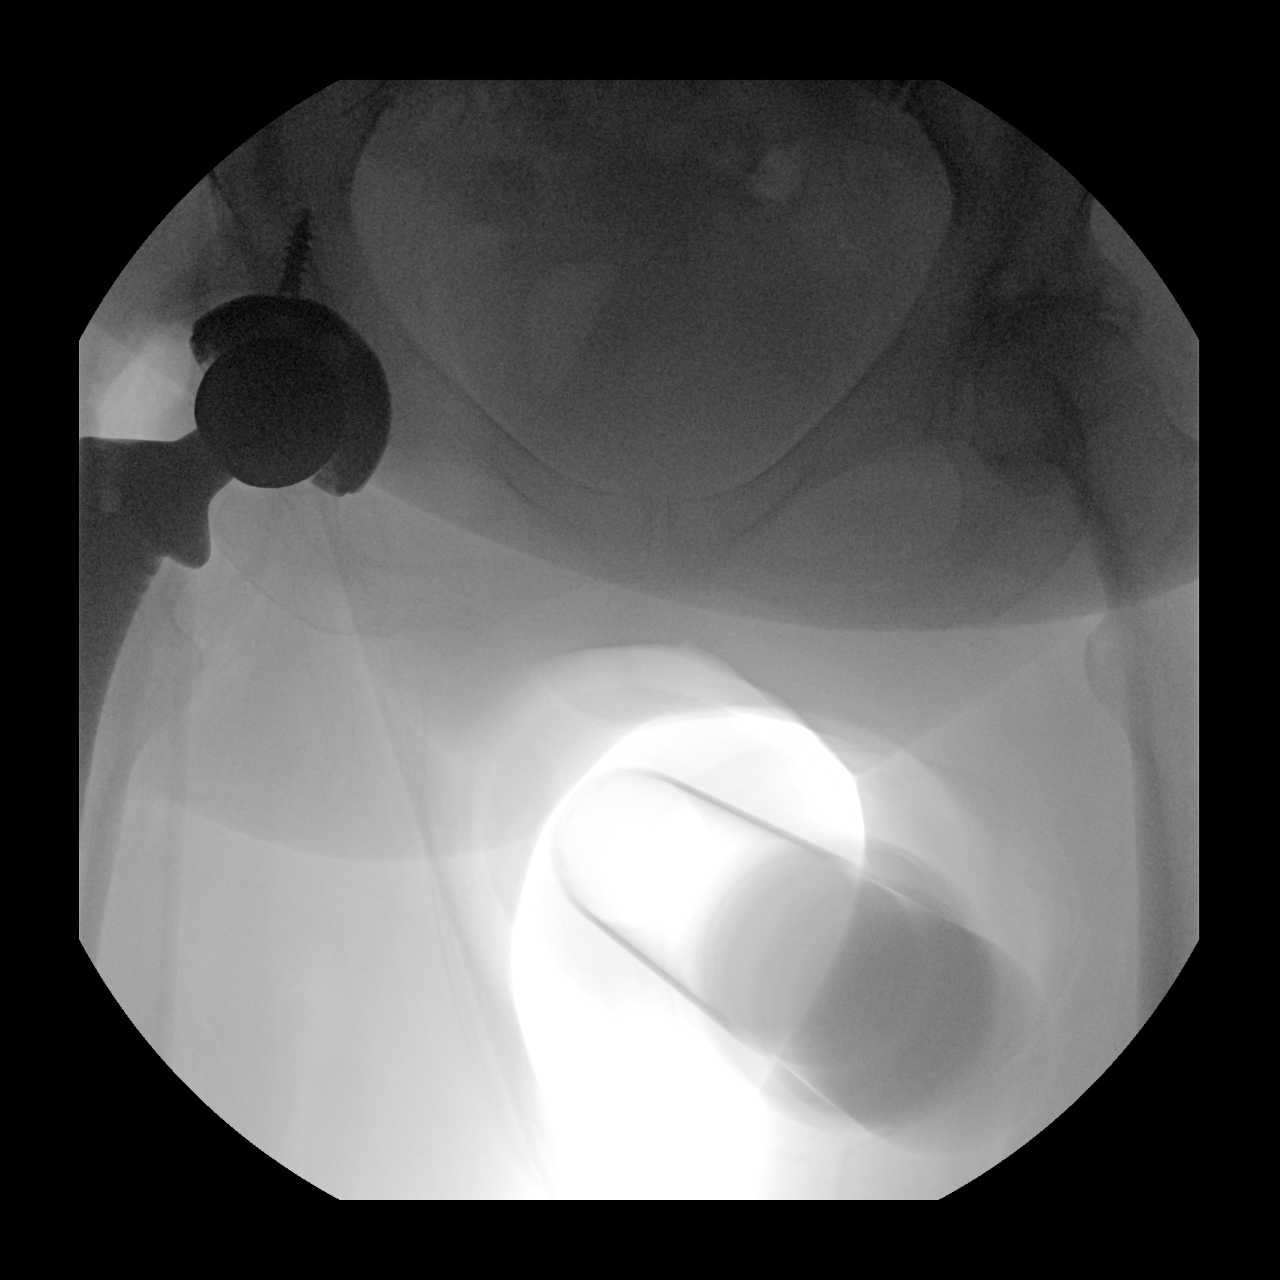

[3 of 3 positions shown; findings below may reference images not displayed]

FINDINGS: Three intraoperative fluoroscopic images were obtained of the right
hip. These demonstrate the right acetabular and femoral components
to be in grossly good position. Expected postoperative changes are
seen in the surrounding soft tissues.
IMPRESSION: Status post right total hip arthroplasty.

## 2022-04-12 IMAGING — RF DG C-ARM 1-60 MIN
1 series · 3 of 3 positions shown · non-contrast
Comparison: PELVIS RADIOGRAPH 06/23/2020.

CLINICAL DATA: 59-year-old female left hip replacement.
Pre-existing right hip replacement.

EXAM:
OPERATIVE LEFT HIP (WITH PELVIS IF PERFORMED) 3 VIEWS
TECHNIQUE: Fluoroscopic spot image(s) were submitted for interpretation
post-operatively.

[Series 1: unknown protocol · 0.20mm/px · 3 of 3 slices shown]
[im 1/3]
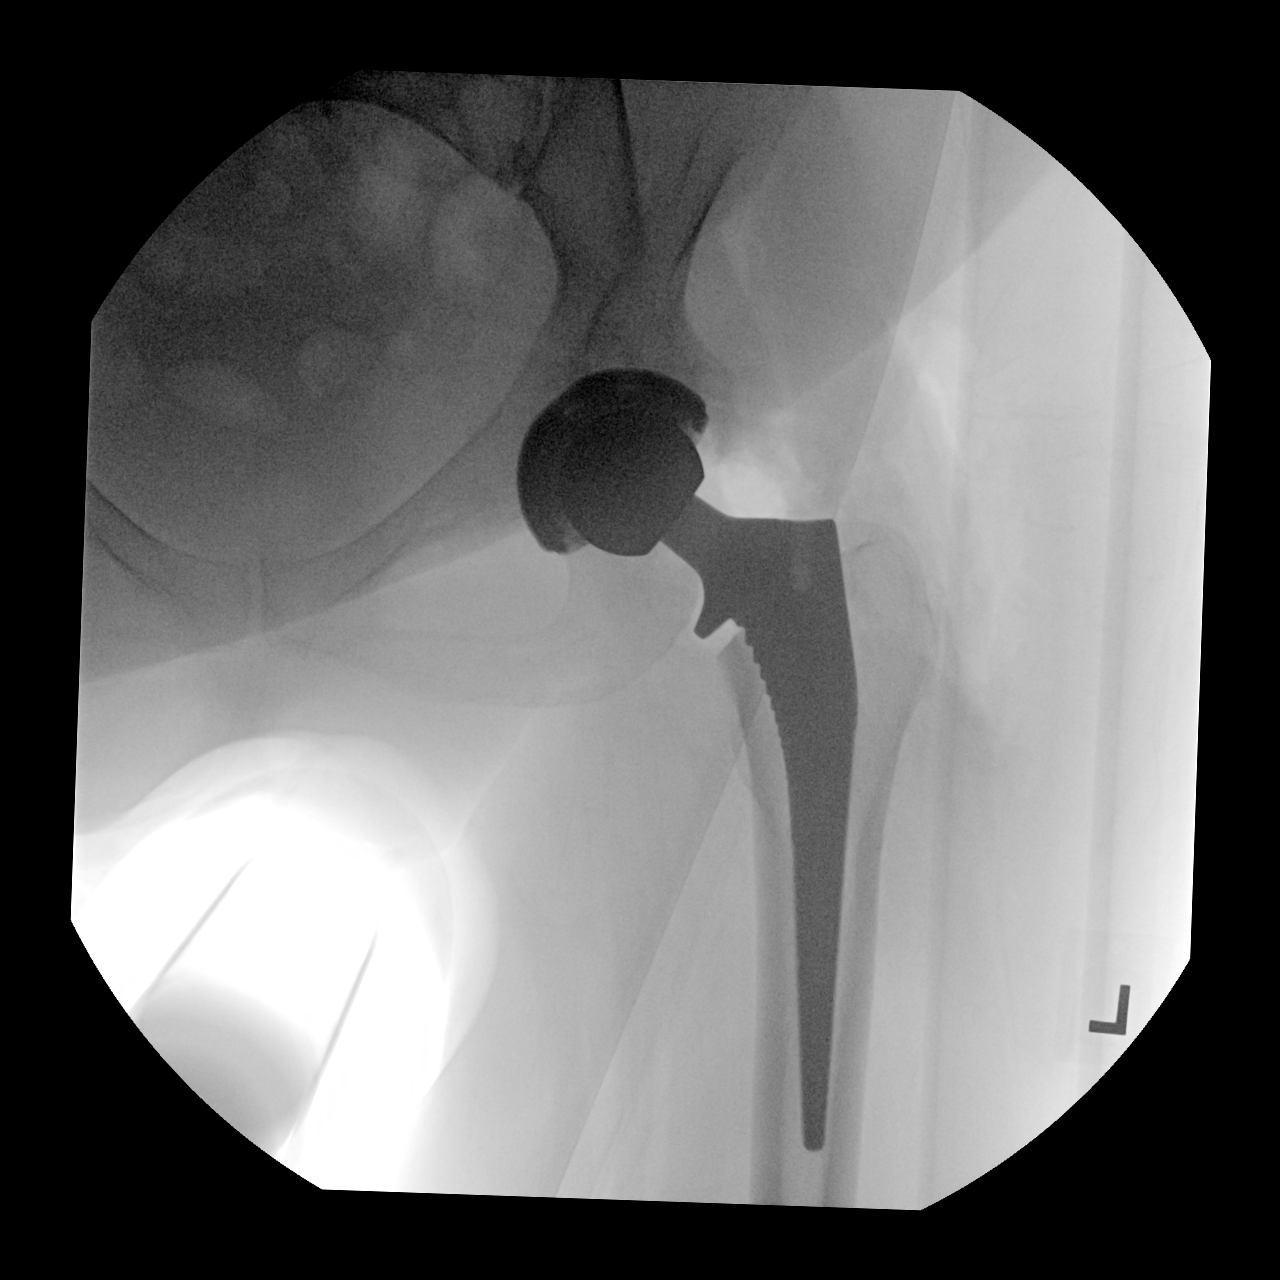
[im 2/3]
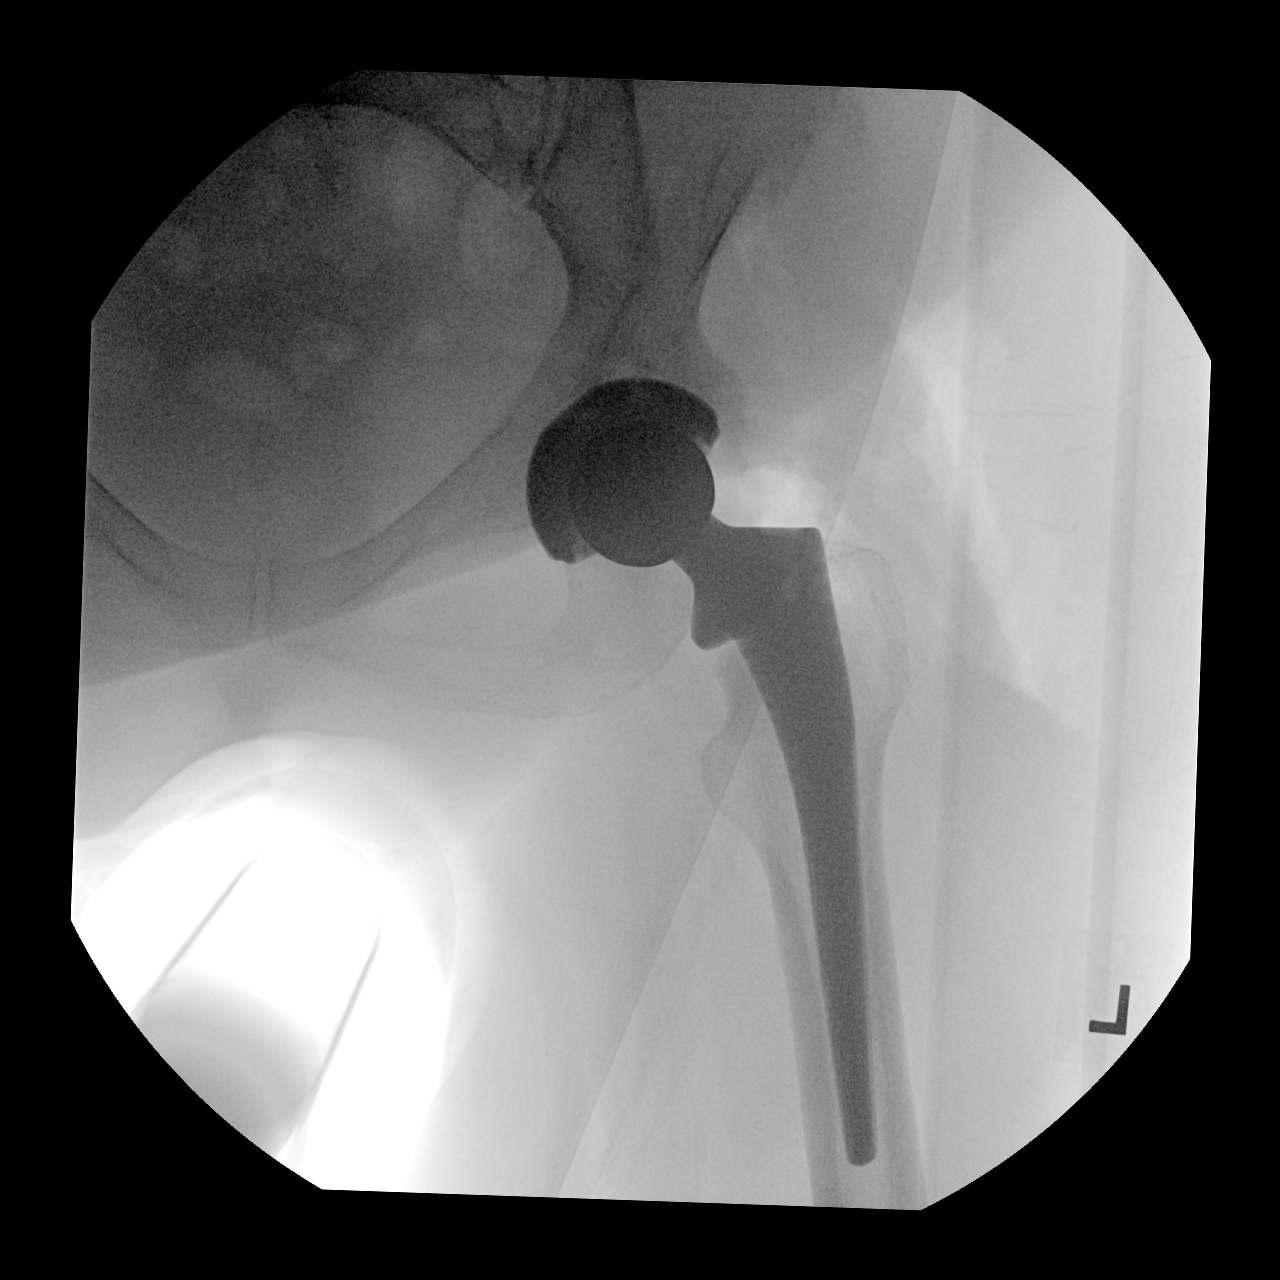
[im 3/3]
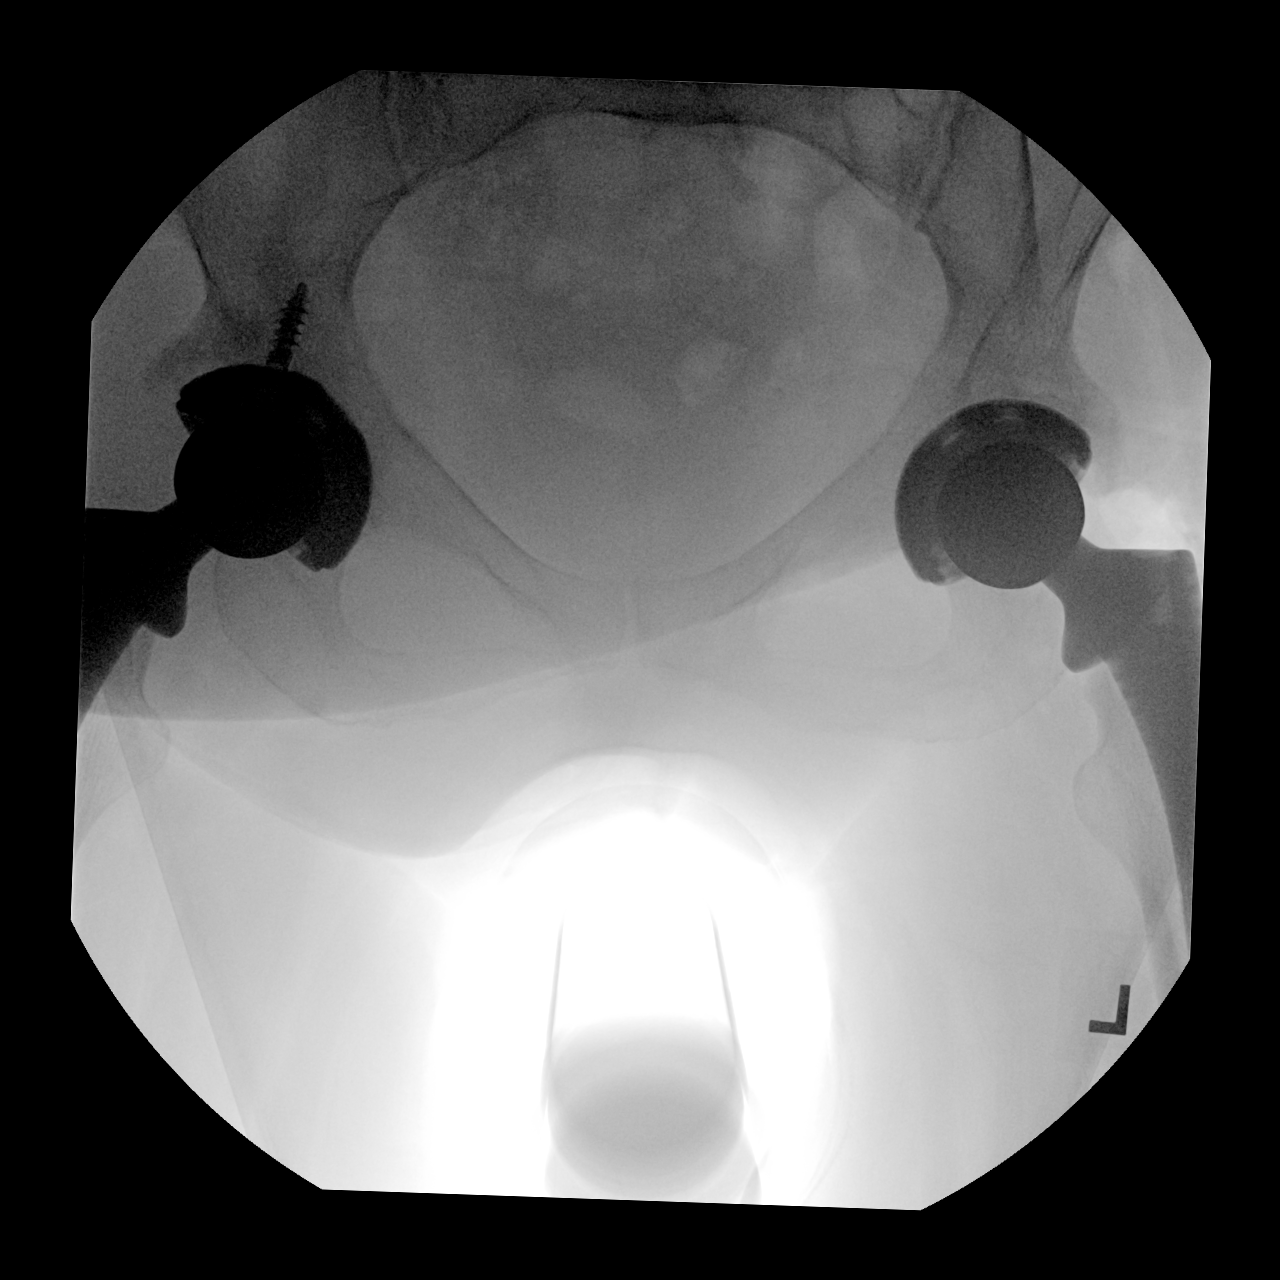

[3 of 3 positions shown; findings below may reference images not displayed]

FINDINGS: 3 intraoperative fluoroscopic spot views of the left hip and lower
pelvis. New left hip bipolar arthroplasty hardware appears intact
and normally aligned. Partially visible pre-existing right total hip
arthroplasty. No unexpected osseous changes.

FLUOROSCOPY TIME:  0 minutes 22 seconds
IMPRESSION: New left hip bipolar arthroplasty with no adverse features.
